# Patient Record
Sex: Male | Born: 1980 | Race: White | Hispanic: No | Marital: Married | Smoking: Former smoker
Health system: Southern US, Community
[De-identification: ages and names within clinical notes are randomized; demographics above are authoritative.]

## PROBLEM LIST (undated history)

## (undated) DIAGNOSIS — I10 Essential (primary) hypertension: Secondary | ICD-10-CM

## (undated) DIAGNOSIS — L719 Rosacea, unspecified: Secondary | ICD-10-CM

## (undated) DIAGNOSIS — F17200 Nicotine dependence, unspecified, uncomplicated: Secondary | ICD-10-CM

## (undated) DIAGNOSIS — J301 Allergic rhinitis due to pollen: Secondary | ICD-10-CM

## (undated) DIAGNOSIS — E039 Hypothyroidism, unspecified: Principal | ICD-10-CM

## (undated) DIAGNOSIS — E7800 Pure hypercholesterolemia, unspecified: Secondary | ICD-10-CM

## (undated) DIAGNOSIS — L209 Atopic dermatitis, unspecified: Secondary | ICD-10-CM

## (undated) HISTORY — DX: Essential (primary) hypertension: I10

## (undated) HISTORY — DX: Pure hypercholesterolemia, unspecified: E78.00

## (undated) HISTORY — DX: Rosacea, unspecified: L71.9

## (undated) HISTORY — DX: Atopic dermatitis, unspecified: L20.9

## (undated) HISTORY — DX: Hypothyroidism, unspecified: E03.9

## (undated) HISTORY — DX: Allergic rhinitis due to pollen: J30.1

## (undated) HISTORY — DX: Nicotine dependence, unspecified, uncomplicated: F17.200

---

## 2012-12-27 ENCOUNTER — Ambulatory Visit (INDEPENDENT_AMBULATORY_CARE_PROVIDER_SITE_OTHER): Payer: BC Managed Care – PPO | Admitting: Family Medicine

## 2012-12-27 ENCOUNTER — Encounter: Payer: Self-pay | Admitting: Family Medicine

## 2012-12-27 VITALS — BP 118/81 | HR 66 | Temp 98.3°F | Resp 16 | Ht 68.0 in | Wt 177.0 lb

## 2012-12-27 DIAGNOSIS — Z23 Encounter for immunization: Secondary | ICD-10-CM

## 2012-12-27 DIAGNOSIS — F17200 Nicotine dependence, unspecified, uncomplicated: Secondary | ICD-10-CM

## 2012-12-27 DIAGNOSIS — Z720 Tobacco use: Secondary | ICD-10-CM

## 2012-12-27 DIAGNOSIS — Z0000 Encounter for general adult medical examination without abnormal findings: Secondary | ICD-10-CM | POA: Insufficient documentation

## 2012-12-27 HISTORY — DX: Nicotine dependence, unspecified, uncomplicated: F17.200

## 2012-12-27 NOTE — Patient Instructions (Signed)

## 2012-12-27 NOTE — Progress Notes (Signed)
S:  This 32 y.o. Cauc male is in good healthy; he is a tobacco user- < 1/2 ppd. He and his wife are expecting their 1st child in August. He needs Tdap. Pt denies recent febrile illness but has had a mildly prod cough w/ clear phlegm.  He is sch for CPE on January 13, 2013. Pt had no other complaints.  PMHx, Soc Hx and Fam Hx reviewed.  ROS: Noncontributory.  O:  Filed Vitals:   12/27/12 0936  BP: 118/81  Pulse: 66  Temp: 98.3 F (36.8 C)  Resp: 16   GEN: In NAD: WN,WD. HEENT: [place]/AT; EOMI w/ clear conj/sclerae. EACs/TMs normal. Post ph mildly erythematous w/o lesions or exudate. NECK: Supple w/o LAN or TMG.  LUNGS: CTA; no wheezes or rhonchi. SKIN: W&D; no rashes or pallor. NEURO: A&O x 3; CNs intact. Nonfocal.  A/P: Need for prophylactic vaccination with combined diphtheria-tetanus-pertussis (DTP) vaccine - Plan: Tdap vaccine greater than or equal to 7yo IM  Tobacco user- advised smoking cessation prior to birth of child.

## 2013-01-13 ENCOUNTER — Other Ambulatory Visit: Payer: Self-pay | Admitting: Physician Assistant

## 2013-01-13 ENCOUNTER — Encounter: Payer: Self-pay | Admitting: Physician Assistant

## 2013-01-13 ENCOUNTER — Ambulatory Visit (INDEPENDENT_AMBULATORY_CARE_PROVIDER_SITE_OTHER): Payer: BC Managed Care – PPO | Admitting: Physician Assistant

## 2013-01-13 VITALS — BP 122/76 | HR 66 | Temp 98.2°F | Resp 16 | Ht 67.75 in | Wt 175.8 lb

## 2013-01-13 DIAGNOSIS — Z0000 Encounter for general adult medical examination without abnormal findings: Secondary | ICD-10-CM

## 2013-01-13 DIAGNOSIS — E039 Hypothyroidism, unspecified: Secondary | ICD-10-CM

## 2013-01-13 DIAGNOSIS — F17200 Nicotine dependence, unspecified, uncomplicated: Secondary | ICD-10-CM

## 2013-01-13 DIAGNOSIS — M549 Dorsalgia, unspecified: Secondary | ICD-10-CM

## 2013-01-13 LAB — POCT UA - MICROSCOPIC ONLY
Bacteria, U Microscopic: NEGATIVE
RBC, urine, microscopic: NEGATIVE
WBC, Ur, HPF, POC: NEGATIVE

## 2013-01-13 LAB — POCT URINALYSIS DIPSTICK
Bilirubin, UA: NEGATIVE
Glucose, UA: NEGATIVE
Ketones, UA: NEGATIVE
Leukocytes, UA: NEGATIVE
Protein, UA: NEGATIVE

## 2013-01-13 MED ORDER — PREDNISONE 20 MG PO TABS: ORAL_TABLET | ORAL | Status: DC

## 2013-01-13 MED ORDER — VARENICLINE TARTRATE 0.5 MG X 11 & 1 MG X 42 PO MISC: ORAL | Status: DC

## 2013-01-13 MED ORDER — CYCLOBENZAPRINE HCL 5 MG PO TABS: 5.0000 mg | ORAL_TABLET | Freq: Three times a day (TID) | ORAL | Status: DC | PRN

## 2013-01-13 NOTE — Progress Notes (Signed)
  Subjective:    Patient ID: Gregory Jennings, male    DOB: 18-Apr-1981, 32 y.o.   MRN: [id number]  HPI    Review of Systems  Constitutional: Negative.   HENT: Negative.   Eyes: Negative.   Respiratory: Positive for cough.   Cardiovascular: Negative.   Gastrointestinal: Negative.   Endocrine: Negative.   Genitourinary: Negative.   Musculoskeletal: Positive for back pain.  Skin: Negative.   Allergic/Immunologic: Negative.   Neurological: Negative.   Hematological: Negative.   Psychiatric/Behavioral: Negative.        Objective:   Physical Exam        Assessment & Plan:

## 2013-01-13 NOTE — Progress Notes (Signed)
Patient ID: Gregory Jennings MRN: [id number], DOB: [birth year] 31 y.o. Date of Encounter: 01/13/2013, 2:00 PM  Primary Physician: No primary provider on file.  Chief Complaint: Physical (CPE)  HPI: 32 y.o. male with history noted below here for CPE. Doing well. Not sure when his last CPE was. First child is due 01/30/13. Excited. Received TDaP 12/27/12 prior to the birth of his first child. Has tapered down his smoking from a 1/2 pack per day to 1-3 cigarettes per week, usually only when playing golf. Currently using Nicorette gum to help him quit smoking. Since he has started chewing this he has noticed a tickle in the back of his throat the he has to clear. He would like to quite smoking a together. He is open to starting Chantix. No history of depression or SI.   He was lifting a box a couple weeks ago and notes occasional left sided pains. He describes it similar to a pinched nerve. Pain waxes and wanes when he twists a certain way. Currently asymptomatic. No loss of bowel or bladder function. No radiation of pain proximally or distally. No weakness into the legs. No paresthesias. He also notes occasional muscles knots or spasms that are long standing, he sees a chiropractor for these.     Review of Systems: Consitutional: No fever, chills, fatigue, night sweats, lymphadenopathy, or weight changes. Eyes: No visual changes, eye redness, or discharge. ENT/Mouth: Ears: No otalgia, tinnitus, hearing loss, discharge. Nose: No congestion, rhinorrhea, sinus pain, or epistaxis. Throat: No sore throat, post nasal drip, or teeth pain. Cardiovascular: No CP, palpitations, diaphoresis, DOE, edema, orthopnea, PND. Respiratory: Positive for cough. No hemoptysis, SOB, or wheezing. Gastrointestinal: No anorexia, dysphagia, reflux, pain, nausea, vomiting, hematemesis, diarrhea, constipation, BRBPR, or melena. Genitourinary: No dysuria, frequency, urgency, hematuria, incontinence, nocturia, decreased urinary stream,  discharge, impotence, or testicular pain/masses. Musculoskeletal: Positive for back pain. No decreased ROM, myalgias, stiffness, joint swelling, or weakness. Skin: No rash, erythema, lesion changes, pain, warmth, jaundice, or pruritis. Neurological: No headache, dizziness, syncope, seizures, tremors, memory loss, coordination problems, or paresthesias. Psychological: No anxiety, depression, hallucinations, SI/HI. Endocrine: No fatigue, polydipsia, polyphagia, polyuria, or known diabetes.   History reviewed. No pertinent past medical history.   History reviewed. No pertinent past surgical history.  Home Meds:  Prior to Admission medications   Not on File    Allergies: No Known Allergies  History   Social History  . Marital Status: Married    Spouse Name: N/A    Number of Children: N/A  . Years of Education: N/A   Occupational History  . teacher    Social History Main Topics  . Smoking status: Light Tobacco Smoker  . Smokeless tobacco: Not on file  . Alcohol Use: Yes  . Drug Use: No  . Sexually Active: Not on file   Other Topics Concern  . Not on file   Social History Narrative  . No narrative on file    Family History  Problem Relation Age of Onset  . Pancreatitis Mother   . Hypertension Father   . Cancer Maternal Grandfather     colon cancer    Physical Exam: Blood pressure 122/76, pulse 66, temperature 98.2 F (36.8 C), temperature source Oral, resp. rate 16, height 5' 7.75" (1.721 m), weight 175 lb 12.8 oz (79.742 kg), SpO2 98.00%.  General: Well developed, well nourished, in no acute distress. HEENT: Normocephalic, atraumatic. Conjunctiva pink, sclera non-icteric. Pupils 2 mm constricting to 1 mm, round, regular, and equally reactive  to light and accomodation. EOMI. Internal auditory canal clear. TMs with good cone of light and without pathology. Nasal mucosa pink. Nares are without discharge. No sinus tenderness. Oral mucosa pink. Dentition normal. Pharynx  without exudate.   Neck: Supple. Trachea midline. No thyromegaly. Full ROM. No lymphadenopathy. Lungs: Clear to auscultation bilaterally without wheezes, rales, or rhonchi. Breathing is of normal effort and unlabored. Cardiovascular: RRR with S1 S2. No murmurs, rubs, or gallops appreciated. Distal pulses 2+ symmetrically. No carotid or abdominal bruits. Abdomen: Soft, non-tender, non-distended with normoactive bowel sounds. No hepatosplenomegaly or masses. No rebound/guarding. No CVA tenderness. Without hernias.  Genitourinary: Circumcised male. No penile lesions. Testes descended bilaterally, and smooth without tenderness or masses.  Musculoskeletal: Full range of motion and 5/5 strength throughout. Without swelling, atrophy, tenderness, crepitus, or warmth. Extremities without clubbing, cyanosis, or edema. Calves supple. Skin: Warm and moist without erythema, ecchymosis, wounds, or rash. Neuro: A+Ox3. CN II-XII grossly intact. Moves all extremities spontaneously. Full sensation throughout. Normal gait. DTR 2+ throughout upper and lower extremities. Finger to nose intact. Psych:  Responds to questions appropriately with a normal affect.   Studies:  Results for orders placed in visit on 01/13/13  POCT URINALYSIS DIPSTICK      Result Value Range   Color, UA yellow     Clarity, UA clear     Glucose, UA neg     Bilirubin, UA neg     Ketones, UA neg     Spec Grav, UA >=1.030     Blood, UA neg     pH, UA 5.5     Protein, UA neg     Urobilinogen, UA 0.2     Nitrite, UA neg     Leukocytes, UA Negative      CBC, CMET, Lipid, TSH all pending. Patient is fasting.   Assessment/Plan:  32 y.o. male with back strain and tobacco use here for CPE.  1) Back pain -Flexeril 5 mg 1 po tid prn #90 RF 1 -Prednisone 20 mg #12 3x2, 2x2, 1x2 no RF -Allow back time to rest  2. CPE -Received TDaP 12/27/12 -Healthy diet and exercise -Anticipatory guidance  3. Tobacco use -Chantix starting month pack  #42 no RF, SED -Precautions given -Advised smoking cessation -Patient is aware of the health risks to himself and family members  Signed, Eula Listen, PA-C 01/13/2013 2:00 PM

## 2013-01-14 LAB — CBC WITH DIFFERENTIAL/PLATELET
Basophils Absolute: 0 10*3/uL (ref 0.0–0.1)
HCT: 46.4 % (ref 39.0–52.0)
Hemoglobin: 16.1 g/dL (ref 13.0–17.0)
Lymphocytes Relative: 32 % (ref 12–46)
Lymphs Abs: 1.3 10*3/uL (ref 0.7–4.0)
Monocytes Absolute: 0.5 10*3/uL (ref 0.1–1.0)
Neutro Abs: 2.2 10*3/uL (ref 1.7–7.7)
RBC: 5.18 MIL/uL (ref 4.22–5.81)
RDW: 13.4 % (ref 11.5–15.5)
WBC: 4.3 10*3/uL (ref 4.0–10.5)

## 2013-01-14 LAB — LIPID PANEL
LDL Cholesterol: 130 mg/dL — ABNORMAL HIGH (ref 0–99)
VLDL: 27 mg/dL (ref 0–40)

## 2013-01-14 LAB — COMPREHENSIVE METABOLIC PANEL
ALT: 20 U/L (ref 0–53)
Albumin: 4.9 g/dL (ref 3.5–5.2)
Alkaline Phosphatase: 35 U/L — ABNORMAL LOW (ref 39–117)
CO2: 25 mEq/L (ref 19–32)
Potassium: 4.5 mEq/L (ref 3.5–5.3)
Sodium: 139 mEq/L (ref 135–145)
Total Bilirubin: 0.5 mg/dL (ref 0.3–1.2)
Total Protein: 7 g/dL (ref 6.0–8.3)

## 2013-01-14 LAB — T3: T3, Total: 84.5 ng/dL (ref 80.0–204.0)

## 2013-01-14 NOTE — Addendum Note (Signed)
Addended by: Sondra Barges on: 01/14/2013 09:19 AM   Modules accepted: Orders

## 2013-01-16 ENCOUNTER — Telehealth: Payer: Self-pay

## 2013-01-16 NOTE — Telephone Encounter (Signed)
Patient returning a call about lab results. [phone]

## 2013-01-16 NOTE — Telephone Encounter (Signed)
Please call the patient. I apologize for the delay in getting your labs to you, I had to add on 2 tests related to your thyroid. Thyroid function studies reveal subclinical hypothyroidism. This is where your thyroid is still functioning good enough to not require medication, but the labs reveal it does appear to be slowing down. It is good to follow up this value with a repeat study in a month, as they can be transient so I do want to repeat this before we go ahead and make this final. So lets recheck in a month to discuss this further. Please let him know his cholesterol numbers and glucose.  Thanks, called patient about labs. Left another message/ see lab.

## 2013-02-18 ENCOUNTER — Telehealth: Payer: Self-pay

## 2013-02-18 NOTE — Telephone Encounter (Signed)
Spoke with pt advised labs are not fasting and he can come in for lab draw only. Pt understood.

## 2013-02-18 NOTE — Telephone Encounter (Signed)
Patient is needing lab redone per ryan but I don not see an order for the lab work.  Patient wants to know if he needs to be fasting and if we can put in an order for the labs to be done without being seen by a provider.   [phone]

## 2013-06-17 ENCOUNTER — Other Ambulatory Visit: Payer: Self-pay | Admitting: Family Medicine

## 2013-06-17 DIAGNOSIS — R7989 Other specified abnormal findings of blood chemistry: Secondary | ICD-10-CM

## 2013-06-18 ENCOUNTER — Ambulatory Visit
Admission: RE | Admit: 2013-06-18 | Discharge: 2013-06-18 | Disposition: A | Discharge status: Home / Self Care | Payer: BC Managed Care – PPO | Source: Ambulatory Visit | Attending: Family Medicine | Admitting: Family Medicine

## 2013-06-18 DIAGNOSIS — R7989 Other specified abnormal findings of blood chemistry: Secondary | ICD-10-CM

## 2014-05-02 ENCOUNTER — Ambulatory Visit (INDEPENDENT_AMBULATORY_CARE_PROVIDER_SITE_OTHER): Payer: BC Managed Care – PPO | Admitting: Family Medicine

## 2014-05-02 VITALS — BP 112/86 | HR 86 | Temp 98.4°F | Resp 16 | Ht 68.75 in | Wt 181.4 lb

## 2014-05-02 DIAGNOSIS — R509 Fever, unspecified: Secondary | ICD-10-CM

## 2014-05-02 DIAGNOSIS — J029 Acute pharyngitis, unspecified: Secondary | ICD-10-CM

## 2014-05-02 LAB — POCT INFLUENZA A/B
INFLUENZA A, POC: NEGATIVE
Influenza B, POC: NEGATIVE

## 2014-05-02 LAB — POCT CBC
Granulocyte percent: 77.8 %G (ref 37–80)
HCT, POC: 45.2 % (ref 43.5–53.7)
Hemoglobin: 15.2 g/dL (ref 14.1–18.1)
Lymph, poc: 1.2 (ref 0.6–3.4)
MCH, POC: 30.9 pg (ref 27–31.2)
MCHC: 33.6 g/dL (ref 31.8–35.4)
MCV: 92.1 fL (ref 80–97)
MID (CBC): 0.9 (ref 0–0.9)
MPV: 7 fL (ref 0–99.8)
POC Granulocyte: 7.4 — AB (ref 2–6.9)
POC LYMPH %: 12.3 % (ref 10–50)
POC MID %: 9.9 % (ref 0–12)
Platelet Count, POC: 214 10*3/uL (ref 142–424)
RBC: 4.91 M/uL (ref 4.69–6.13)
RDW, POC: 12.8 %
WBC: 9.5 10*3/uL (ref 4.6–10.2)

## 2014-05-02 LAB — POCT RAPID STREP A (OFFICE): RAPID STREP A SCREEN: NEGATIVE

## 2014-05-02 MED ORDER — PENICILLIN V POTASSIUM 500 MG PO TABS: 500.0000 mg | ORAL_TABLET | Freq: Two times a day (BID) | ORAL | Status: DC

## 2014-05-02 NOTE — Progress Notes (Signed)
[facility] [address], [place] [address] [phone]  Date:  05/02/2014   Name:  Gregory Jennings Maye   DOB:  06/13/1980   MRN:  [id number]  PCP:  No primary care provider on file.    Chief Complaint: Fever; Chills; Sore Throat; Generalized Body Aches; and Headache   History of Present Illness:  Gregory Jennings Mcguiness is a 33 y.o. very pleasant male patient who presents with the following:  His son was recently ill with a ST and fever; he was tx with augmentin and is getting better.  He is here today with headches, aches, fever up to 102 yesterday,   He is using ibuprofen and did take some this am He notes a hoarse voice.  His ST and pain with swallowing is getting worse.   No GI symtoms.   He is generally in good health   They have a 15 month son at home, and his wife is due again in February.   He is worried about getting other people ill at home Patient Active Problem List   Diagnosis Date Noted  . Health care maintenance 12/27/2012  . Tobacco user 12/27/2012    No past medical history on file.  No past surgical history on file.  History  Substance Use Topics  . Smoking status: Former Smoker    Quit date: 07/02/2008  . Smokeless tobacco: Not on file  . Alcohol Use: 0.0 oz/week    0 Not specified per week    Family History  Problem Relation Age of Onset  . Pancreatitis Mother   . Hypertension Father   . Cancer Maternal Grandfather     colon cancer    No Known Allergies  Medication list has been reviewed and updated.  Current Outpatient Prescriptions on File Prior to Visit  Medication Sig Dispense Refill  . cyclobenzaprine (FLEXERIL) 5 MG tablet Take 1 tablet (5 mg total) by mouth 3 (three) times daily as needed for muscle spasms. 90 tablet 1  . predniSONE (DELTASONE) 20 MG tablet 3 PO FOR 2 DAYS, 2 PO FOR 2 DAYS, 1 PO FOR 2 DAYS 12 tablet 0  . varenicline (CHANTIX STARTING MONTH PAK) 0.5 MG X 11 & 1 MG X 42 tablet AS DIRECTED 42 tablet 0    No current facility-administered medications on file prior to visit.    Review of Systems:  As per HPI- otherwise negative.   Physical Examination: Filed Vitals:   05/02/14 0905  BP: 112/86  Pulse: 86  Temp: 98.4 F (36.9 C)  Resp: 16   Filed Vitals:   05/02/14 0905  Height: 5' 8.75" (1.746 m)  Weight: 181 lb 6.4 oz (82.283 kg)   Body mass index is 26.99 kg/(m^2). Ideal Body Weight: Weight in (lb) to have BMI = 25: 167.7  GEN: WDWN, NAD, Non-toxic, A & O x , appears to be mildly ill HEENT: Atraumatic, Normocephalic. Neck supple. No masses, No LAD.  Bilateral TM wnl, oropharynx injected but no exudate.  PEERL,EOMI.   Ears and Nose: No external deformity. CV: RRR, No M/G/R. No JVD. No thrill. No extra heart sounds. PULM: CTA B, no wheezes, crackles, rhonchi. No retractions. No resp. distress. No accessory muscle use. ABD: S, NT, ND EXTR: No c/c/e NEURO Normal gait.  PSYCH: Normally interactive. Conversant. Not depressed or anxious appearing.  Calm demeanor.   Results for orders placed or performed in visit on 05/02/14  POCT rapid strep A  Result Value Ref Range  Rapid Strep A Screen Negative Negative  POCT CBC  Result Value Ref Range   WBC 9.5 4.6 - 10.2 K/uL   Lymph, poc 1.2 0.6 - 3.4   POC LYMPH PERCENT 12.3 10 - 50 %L   MID (cbc) 0.9 0 - 0.9   POC MID % 9.9 0 - 12 %M   POC Granulocyte 7.4 (A) 2 - 6.9   Granulocyte percent 77.8 37 - 80 %G   RBC 4.91 4.69 - 6.13 M/uL   Hemoglobin 15.2 14.1 - 18.1 g/dL   HCT, POC 62.145.2 30.843.5 - 53.7 %   MCV 92.1 80 - 97 fL   MCH, POC 30.9 27 - 31.2 pg   MCHC 33.6 31.8 - 35.4 g/dL   RDW, POC 65.712.8 %   Platelet Count, POC 214 142 - 424 K/uL   MPV 7.0 0 - 99.8 fL  POCT Influenza A/B  Result Value Ref Range   Influenza A, POC Negative    Influenza B, POC Negative      Assessment and Plan: Sore throat - Plan: POCT rapid strep A, Culture, Group A Strep, penicillin v potassium (VEETID) 500 MG tablet  Other specified fever -  Plan: POCT rapid strep A, POCT CBC, POCT Influenza A/B  Testing negative as above.  Will cover with penicillin for possible strep throat while we await throat culture.  He will rest, let me know if not better in the next few days   Signed Abbe AmsterdamJessica Nahiara Kretzschmar, MD

## 2014-05-02 NOTE — Patient Instructions (Signed)
Rest and drink plenty of fluids, tylenol and ibuprofen as needed We are going to treat you for possible strep throat with penicillin I will be in touch with your culture asap Let me know if you do not feel better in 1-2 days- Sooner if worse.

## 2014-05-04 ENCOUNTER — Telehealth: Payer: Self-pay | Admitting: *Deleted

## 2014-05-04 LAB — CULTURE, GROUP A STREP: Organism ID, Bacteria: NORMAL

## 2014-05-04 NOTE — Telephone Encounter (Signed)
Pt called for lab results and they are not back yet.  He states that he is no better.  Advised that he RTC.

## 2015-02-21 ENCOUNTER — Ambulatory Visit (INDEPENDENT_AMBULATORY_CARE_PROVIDER_SITE_OTHER): Payer: BC Managed Care – PPO | Admitting: Physician Assistant

## 2015-02-21 VITALS — BP 129/89 | HR 71 | Temp 98.4°F | Resp 16 | Ht 68.0 in | Wt 194.4 lb

## 2015-02-21 DIAGNOSIS — M546 Pain in thoracic spine: Secondary | ICD-10-CM

## 2015-02-21 MED ORDER — TRAMADOL HCL 50 MG PO TABS: 50.0000 mg | ORAL_TABLET | Freq: Three times a day (TID) | ORAL | Status: DC | PRN

## 2015-02-21 MED ORDER — MELOXICAM 15 MG PO TABS: 15.0000 mg | ORAL_TABLET | Freq: Every day | ORAL | Status: DC

## 2015-02-21 MED ORDER — CYCLOBENZAPRINE HCL ER 15 MG PO CP24: 15.0000 mg | ORAL_CAPSULE | Freq: Every day | ORAL | Status: DC | PRN

## 2015-02-21 NOTE — Progress Notes (Signed)
   Subjective:    Patient ID: Gregory Jennings, male    DOB: 1980-12-28, 34 y.o.   MRN: [id number]  HPI Patient presents for left-sided middle back pain that has been present for the past 3 days. Pain is achy and sharp at times. Is 8/10 on pain scale and is now radiating to left side. Pain is constant and is marginally relieved with ibuprofen. Used anikan cream which broke him out and didn't help with pain. Denies numbness, weakness, or loss of fxn/sensation/ROM. Denies trauma, but admits that he may have worked out improperly when lifting weights. Rest of back unaffected and no pain or numbness of lower extremity. No urinary or bowel changes. NKDA.   Review of Systems  Constitutional: Negative.  Negative for fever and activity change.  Genitourinary: Negative.   Musculoskeletal: Positive for back pain. Negative for joint swelling, arthralgias, gait problem, neck pain and neck stiffness.  Skin: Positive for rash.  Neurological: Negative for dizziness, weakness, numbness and headaches.       Objective:   Physical Exam  Constitutional: He is oriented to person, place, and time. He appears well-developed and well-nourished. No distress.  Blood pressure 129/89, pulse 71, temperature 98.4 F (36.9 C), temperature source Oral, resp. rate 16, height  (1.727 m), weight 194 lb 6.4 oz (88.179 kg), SpO2 98 %.   HENT:  Head: Normocephalic and atraumatic.  Right Ear: External ear normal.  Left Ear: External ear normal.  Eyes: Conjunctivae are normal. Right eye exhibits no discharge. Left eye exhibits no discharge. No scleral icterus.  Pulmonary/Chest: Effort normal.  Musculoskeletal: Normal range of motion. He exhibits no edema or tenderness.       Right shoulder: Normal.       Left shoulder: Normal.       Cervical back: Normal.       Thoracic back: He exhibits pain. He exhibits normal range of motion, no tenderness, no bony tenderness, no swelling, no edema, no deformity, no laceration and no  spasm.       Lumbar back: Normal.  Neurological: He is alert and oriented to person, place, and time. No cranial nerve deficit. He exhibits normal muscle tone. Coordination normal.  Skin: Skin is warm and dry. Rash (contact dermatitis on left side of back and left flank) noted. He is not diaphoretic. No erythema. No pallor.  Psychiatric: He has a normal mood and affect. His behavior is normal. Judgment and thought content normal.       Assessment & Plan:  1. Left-sided thoracic back pain Back stretches given. Advised to use warm compress/heating pad after exercises and can apply 3-4x daily for 15-20 minutes. - cyclobenzaprine (AMRIX) 15 MG 24 hr capsule; Take 1 capsule (15 mg total) by mouth daily as needed for muscle spasms.  Dispense: 15 capsule; Refill: 0 - meloxicam (MOBIC) 15 MG tablet; Take 1 tablet (15 mg total) by mouth daily.  Dispense: 30 tablet; Refill: 1 - traMADol (ULTRAM) 50 MG tablet; Take 1 tablet (50 mg total) by mouth every 8 (eight) hours as needed.  Dispense: 30 tablet; Refill: 0   Kal Chait PA-C  [facility] 02/21/2015 10:41 AM

## 2015-02-21 NOTE — Patient Instructions (Signed)

## 2015-11-27 IMAGING — US US SOFT TISSUE HEAD/NECK
1 series · 14 of 25 positions shown · non-contrast
Comparison: None.

CLINICAL DATA: Elevated TSH.

EXAM:
THYROID ULTRASOUND
TECHNIQUE: Ultrasound examination of the thyroid gland and adjacent soft
tissues was performed.

[Series 1: us soft tissue head/neck · 0.08mm/px · 14 of 42 slices shown]
[im 1/42]
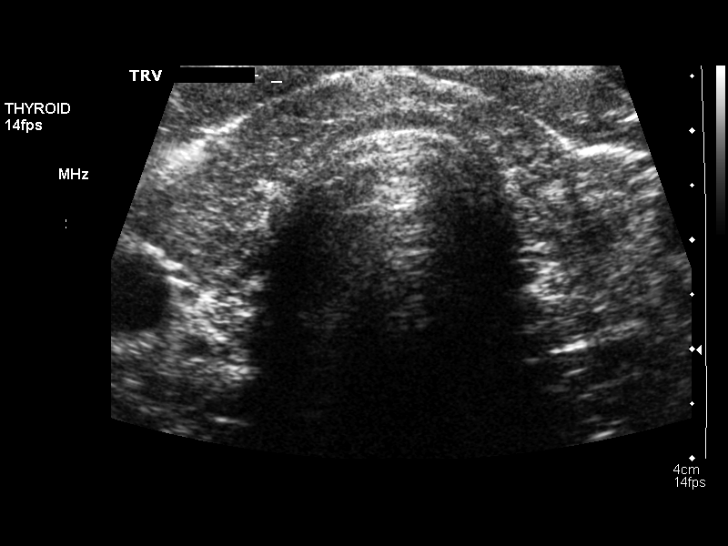
[im 4/42]
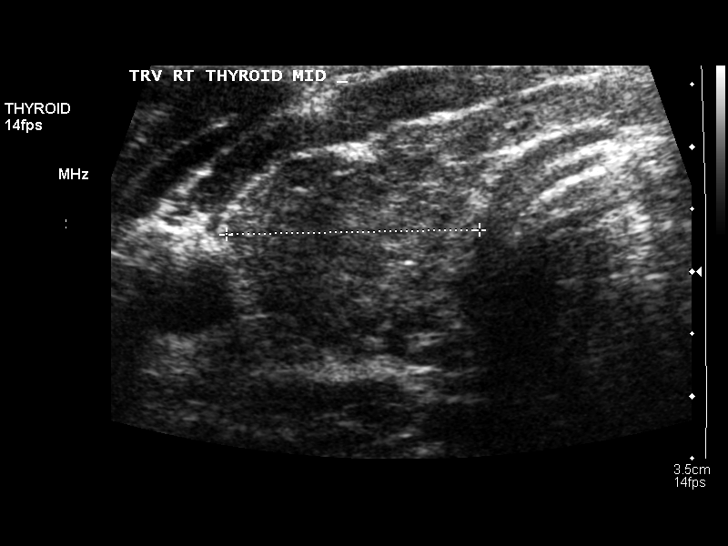
[im 7/42]
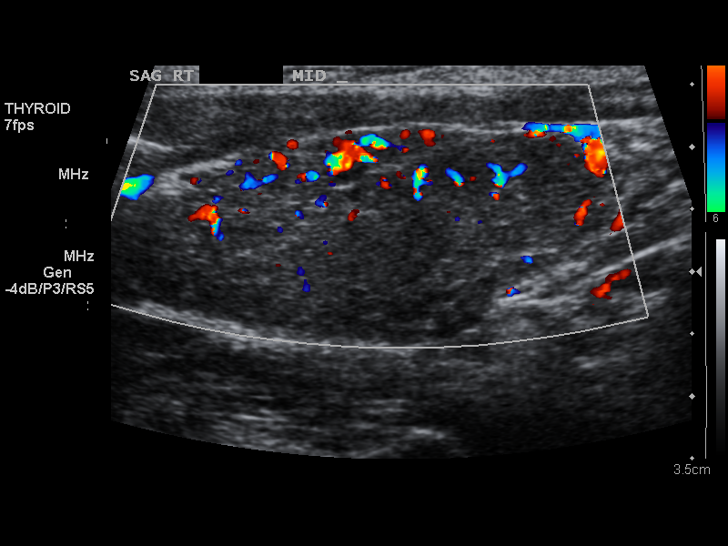
[im 11/42]
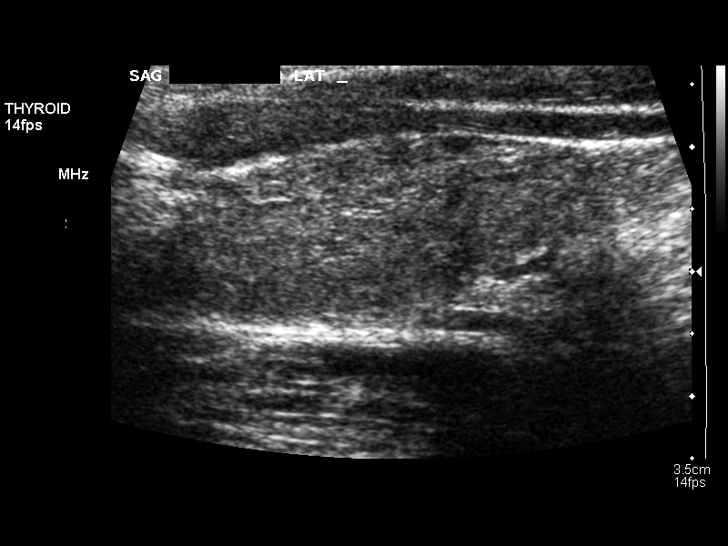
[im 14/42]
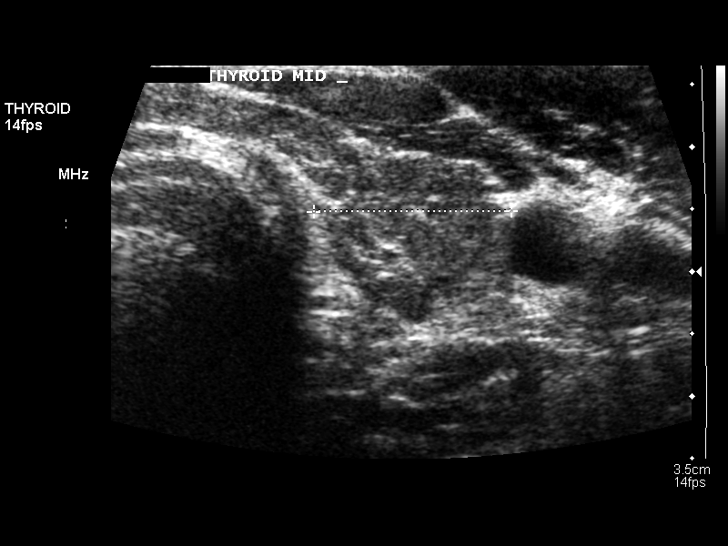
[im 16/42]
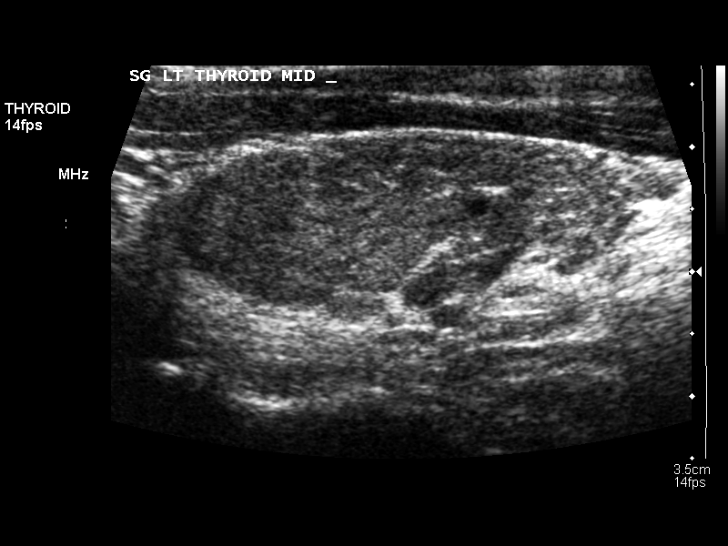
[im 19/42]
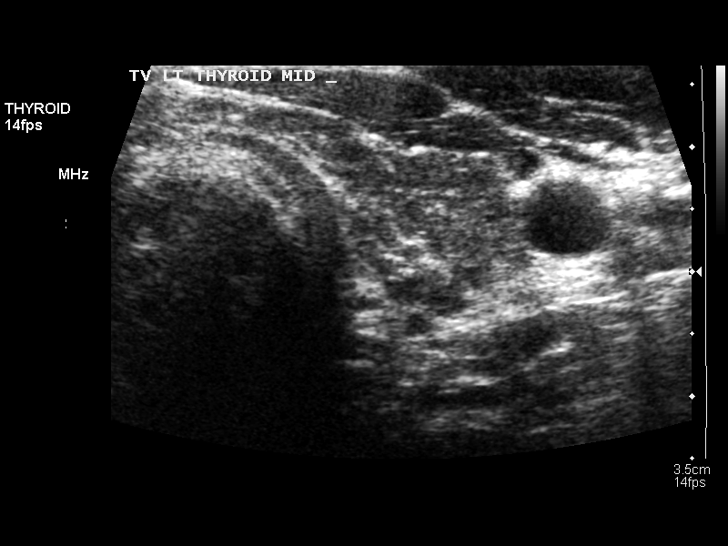
[im 23/42]
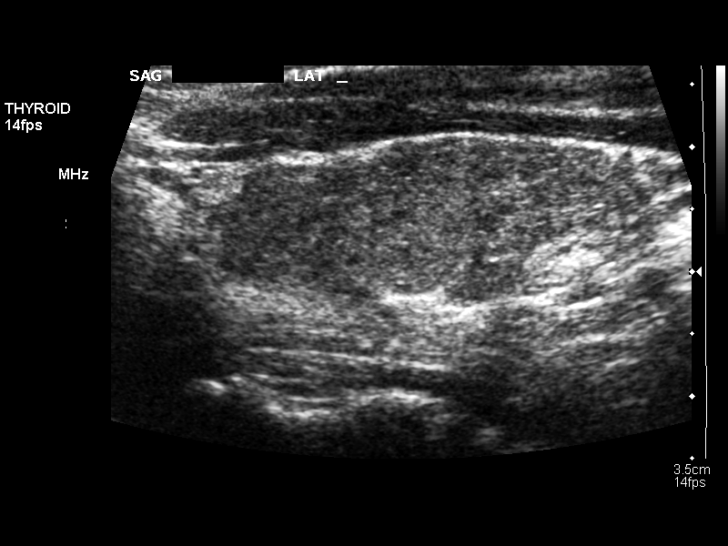
[im 26/42]
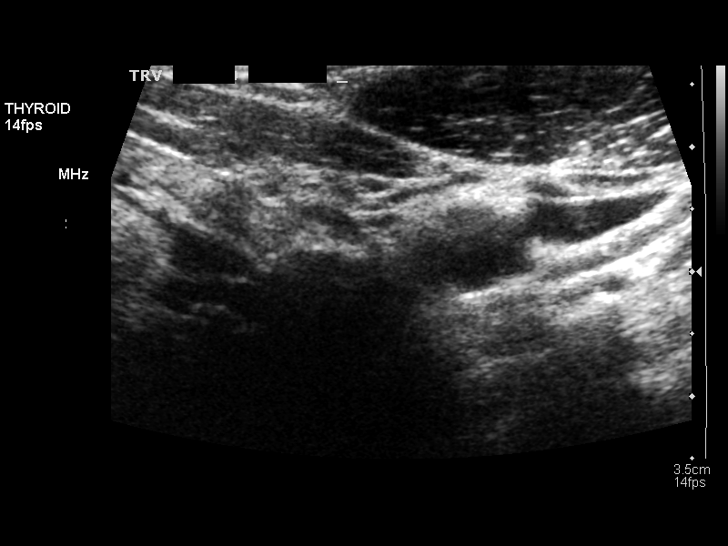
[im 28/42]
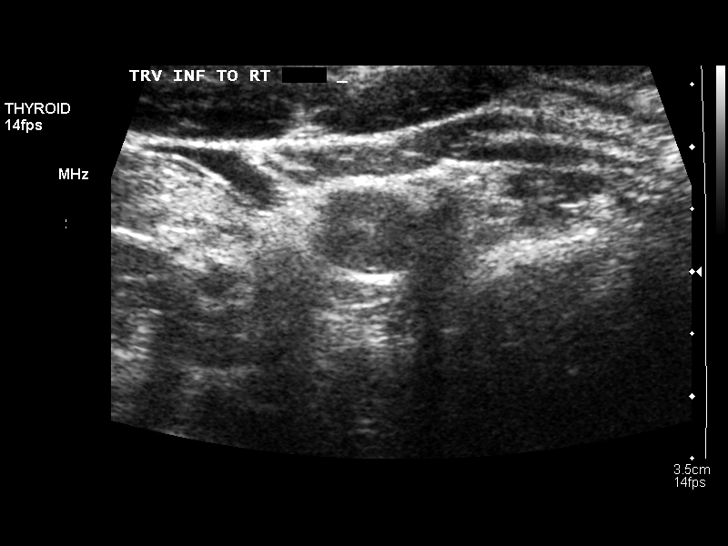
[im 31/42]
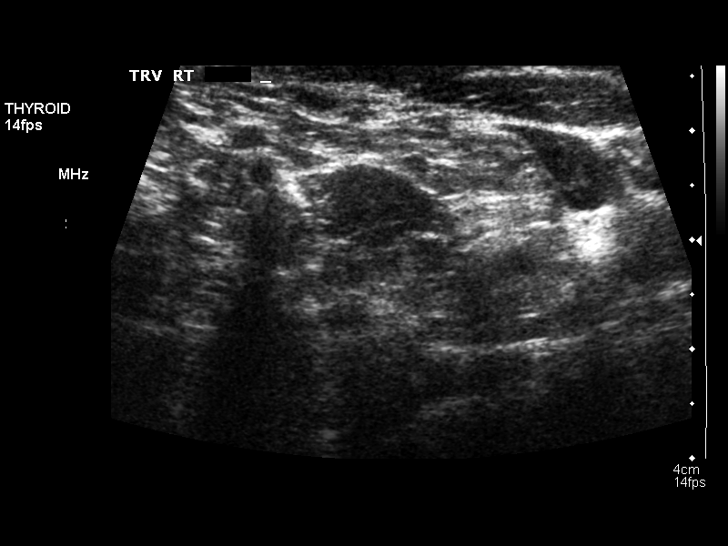
[im 35/42]
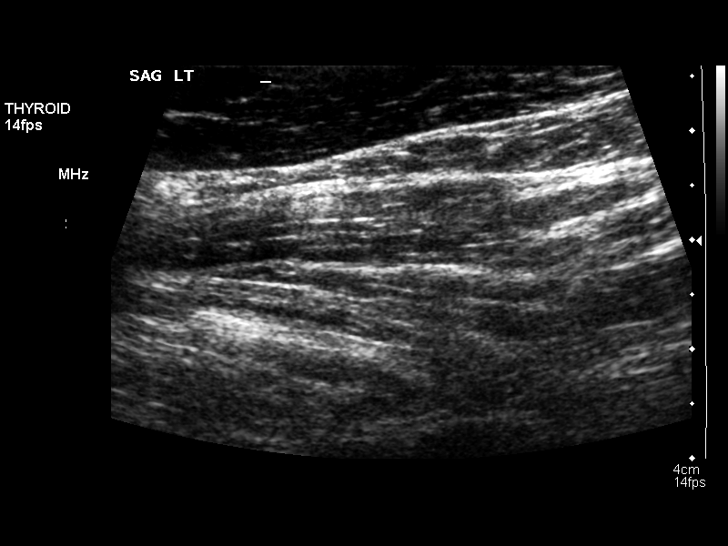
[im 38/42]
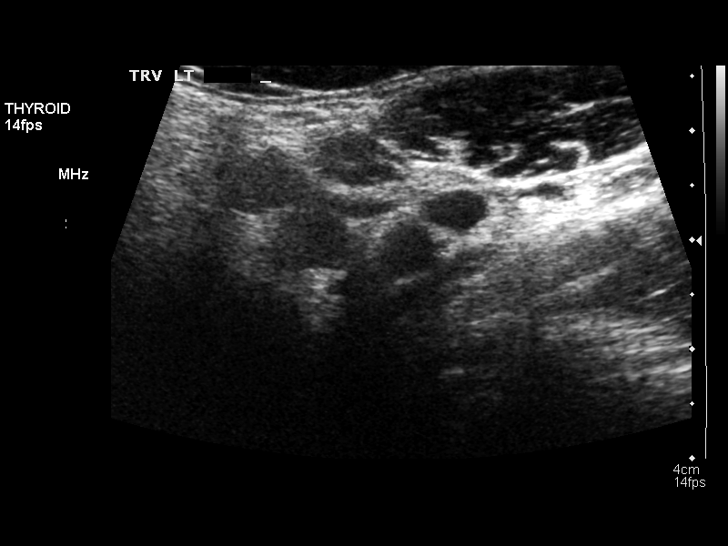
[im 42/42]
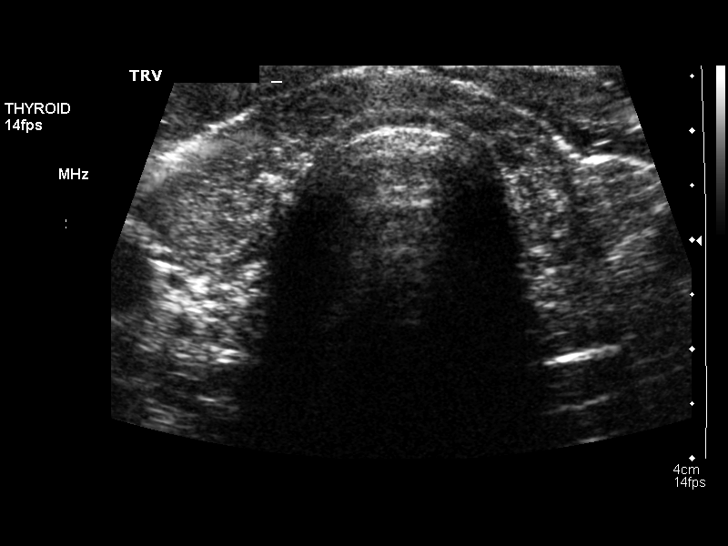

[14 of 25 positions shown; findings below may reference images not displayed]

FINDINGS: Right thyroid lobe

Measurements: 4.3 x 1.7 x 2.0 cm.  No nodules visualized.

Left thyroid lobe

Measurements: 4.0 x 1.5 x 1.6 cm.  No nodules visualized.

Isthmus

Thickness: 0.5 cm.  No nodules visualized.

Thyroid gland volume is at the upper limits of normal. The thyroid
echotexture is mildly heterogeneous bilaterally. There also
subjectively is mildly increased vascularity throughout both lobes
which can be seen in the setting of thyroiditis.

Lymphadenopathy

None visualized.
IMPRESSION: Top-normal thyroid gland size with heterogeneous echotexture noted
bilaterally as well as increased vascularity. This can be seen with
thyroiditis. No solid or cystic nodules are identified.

## 2016-01-24 ENCOUNTER — Ambulatory Visit (INDEPENDENT_AMBULATORY_CARE_PROVIDER_SITE_OTHER): Payer: BC Managed Care – PPO | Admitting: Family Medicine

## 2016-01-24 ENCOUNTER — Encounter: Payer: Self-pay | Admitting: Family Medicine

## 2016-01-24 DIAGNOSIS — M25512 Pain in left shoulder: Secondary | ICD-10-CM | POA: Diagnosis not present

## 2016-01-24 DIAGNOSIS — M62838 Other muscle spasm: Secondary | ICD-10-CM

## 2016-01-24 MED ORDER — TIZANIDINE HCL 4 MG PO TABS: 4.0000 mg | ORAL_TABLET | Freq: Every evening | ORAL | 2 refills | Status: AC

## 2016-01-24 MED ORDER — MELOXICAM 15 MG PO TABS: 15.0000 mg | ORAL_TABLET | Freq: Every day | ORAL | 0 refills | Status: DC

## 2016-01-24 MED ORDER — TRAMADOL HCL 50 MG PO TABS: 50.0000 mg | ORAL_TABLET | Freq: Three times a day (TID) | ORAL | 0 refills | Status: DC | PRN

## 2016-01-24 NOTE — Assessment & Plan Note (Signed)
Patient was given trigger point injections today. Discussed icing regimen, home exercises, given meloxicam, muscle relaxer as well as tramadol for breaks or pain. We discussed home exercises and given a handout. We also discussed with patient about icing as well as even heat. Patient will follow-up in one week if no significant improvement. Past medical history is significant for shingles previously on the side but it was anteriorly. We'll monitor for break out.

## 2016-01-24 NOTE — Patient Instructions (Signed)
Good to see you  Ice 20 minutes 2 times daily. Usually after activity and before bed. Exercises 3 times a week. Start in 48 hours.  Meloxicam daily for next 10 days then as needed Zanaflex at night for 3 nights then as needed Tramadol for breakthrough pain but only if really need and do not drive if you take it.  See me again in 1 week if not perfect or call me.

## 2016-01-24 NOTE — Progress Notes (Signed)
Tawana ScaleZach Braycen Burandt D.O. [facility] [address] [place], [place] [address] Phone: [phone] Subjective:     CC: right low back pain   OZH:[id number]:Subjective  Gregory MellowSpencer Jennings is a 35 y.o. male coming in with complaint of low back pain.  Patient says that this is been going on for 3 days. Seems to be worsening. Seems to be on the upper rider sign. An states that only certain movements seem to be doing them. Possibly an injury while pushing sign on a swing. Denies any radiation down the upper actually lower extremities. Seems to stay located specifically. Has tried the muscle relaxer with very minimal improvement.     No past medical history on file. No past surgical history on file. Social History   Social History  . Marital status: Married    Spouse name: N/A  . Number of children: N/A  . Years of education: N/A   Occupational History  . teacher [employer]   Social History Main Topics  . Smoking status: Former Smoker    Quit date: 07/02/2008  . Smokeless tobacco: None  . Alcohol use 0.0 oz/week  . Drug use: No  . Sexual activity: Not Asked   Other Topics Concern  . None   Social History Narrative  . None   No Known Allergies Family History  Problem Relation Age of Onset  . Pancreatitis Mother   . Hypertension Father   . Cancer Maternal Grandfather     colon cancer    Past medical history, social, surgical and family history all reviewed in electronic medical record.  No pertanent information unless stated regarding to the chief complaint.   Review of Systems: No headache, visual changes, nausea, vomiting, diarrhea, constipation, dizziness, abdominal pain, skin rash, fevers, chills, night sweats, weight loss, swollen lymph nodes, body aches, joint swelling, muscle aches, chest pain, shortness of breath, mood changes.   Objective  Blood pressure 140/86, pulse 83, height 5\' 7"  (1.702 m), weight 181 lb (82.1 kg), SpO2 97 %.  General: No apparent distress alert and  oriented x3 mood and affect normal, dressed appropriately.  HEENT: Pupils equal, extraocular movements intact  Respiratory: Patient's speak in full sentences and does not appear short of breath  Cardiovascular: No lower extremity edema, non tender, no erythema  Skin: Warm dry intact with no signs of infection or rash on extremities or on axial skeleton.  Abdomen: Soft nontender  Neuro: Cranial nerves II through XII are intact, neurovascularly intact in all extremities with 2+ DTRs and 2+ pulses.  Lymph: No lymphadenopathy of posterior or anterior cervical chain or axillae bilaterally.  Gait normal with good balance and coordination.  MSK:  Non tender with full range of motion and good stability and symmetric strength and tone of shoulders, elbows, wrist, hip, knee and ankles bilaterally.   Back Exam:  Inspection: Unremarkable  Motion: Flexion 35 deg, Extension 25 deg, Side Bending to 45 deg bilaterally,  Rotation to 45 deg bilaterally  SLR laying: Negative  XSLR laying: Negative  Palpable tenderness: Severe tenderness in the thoracic spine T7-T10 and paraspinal musculature. Multiple trigger points noted.Marland Kitchen. FABER: negative. Sensory change: Gross sensation intact to all lumbar and sacral dermatomes.  Reflexes: 2+ at both patellar tendons, 2+ at achilles tendons, Babinski's downgoing.  Strength at foot  Plantar-flexion: 5/5 Dorsi-flexion: 5/5 Eversion: 5/5 Inversion: 5/5  Leg strength  Quad: 5/5 Hamstring: 5/5 Hip flexor: 5/5 Hip abductors: 5/5  Gait unremarkable.  After verbal consent patient was prepped with alcohol swabs  and with a 25-gauge half-inch needle was injected with a total of 3 mL of 0.5% Marcaine and 1 mL of Kenalog 40 mg/dL and 4 distinct trigger points Post injection instructions given. Band-Aids placed. No blood loss.   Impression and Recommendations:     This case required medical decision making of moderate complexity.      Note: This dictation was prepared with  Dragon dictation along with smaller phrase technology. Any transcriptional errors that result from this process are unintentional.

## 2017-03-29 ENCOUNTER — Ambulatory Visit (INDEPENDENT_AMBULATORY_CARE_PROVIDER_SITE_OTHER): Payer: BC Managed Care – PPO | Admitting: Family Medicine

## 2017-03-29 ENCOUNTER — Encounter: Payer: Self-pay | Admitting: Family Medicine

## 2017-03-29 VITALS — BP 134/82 | HR 80 | Temp 97.9°F | Ht 68.0 in | Wt 187.8 lb

## 2017-03-29 DIAGNOSIS — Z23 Encounter for immunization: Secondary | ICD-10-CM

## 2017-03-29 DIAGNOSIS — L209 Atopic dermatitis, unspecified: Secondary | ICD-10-CM | POA: Insufficient documentation

## 2017-03-29 DIAGNOSIS — R5383 Other fatigue: Secondary | ICD-10-CM

## 2017-03-29 DIAGNOSIS — E7800 Pure hypercholesterolemia, unspecified: Secondary | ICD-10-CM

## 2017-03-29 DIAGNOSIS — L719 Rosacea, unspecified: Secondary | ICD-10-CM

## 2017-03-29 DIAGNOSIS — E039 Hypothyroidism, unspecified: Secondary | ICD-10-CM | POA: Diagnosis not present

## 2017-03-29 DIAGNOSIS — I10 Essential (primary) hypertension: Secondary | ICD-10-CM | POA: Diagnosis not present

## 2017-03-29 DIAGNOSIS — J301 Allergic rhinitis due to pollen: Secondary | ICD-10-CM | POA: Insufficient documentation

## 2017-03-29 HISTORY — DX: Allergic rhinitis due to pollen: J30.1

## 2017-03-29 HISTORY — DX: Hypothyroidism, unspecified: E03.9

## 2017-03-29 HISTORY — DX: Rosacea, unspecified: L71.9

## 2017-03-29 HISTORY — DX: Atopic dermatitis, unspecified: L20.9

## 2017-03-29 HISTORY — DX: Essential (primary) hypertension: I10

## 2017-03-29 HISTORY — DX: Pure hypercholesterolemia, unspecified: E78.00

## 2017-03-29 LAB — CBC WITH DIFFERENTIAL/PLATELET
Basophils Absolute: 0 10*3/uL (ref 0.0–0.1)
Basophils Relative: 0.8 % (ref 0.0–3.0)
Eosinophils Absolute: 0 10*3/uL (ref 0.0–0.7)
Eosinophils Relative: 0.6 % (ref 0.0–5.0)
HCT: 47.1 % (ref 39.0–52.0)
Hemoglobin: 16.1 g/dL (ref 13.0–17.0)
Lymphocytes Relative: 30.5 % (ref 12.0–46.0)
Lymphs Abs: 1.5 10*3/uL (ref 0.7–4.0)
MCHC: 34.3 g/dL (ref 30.0–36.0)
MCV: 88.6 fl (ref 78.0–100.0)
Monocytes Absolute: 0.6 10*3/uL (ref 0.1–1.0)
Monocytes Relative: 11.9 % (ref 3.0–12.0)
Neutro Abs: 2.7 10*3/uL (ref 1.4–7.7)
Neutrophils Relative %: 56.2 % (ref 43.0–77.0)
Platelets: 301 10*3/uL (ref 150.0–400.0)
RBC: 5.32 Mil/uL (ref 4.22–5.81)
RDW: 12.5 % (ref 11.5–15.5)
WBC: 4.8 10*3/uL (ref 4.0–10.5)

## 2017-03-29 LAB — COMPREHENSIVE METABOLIC PANEL
ALT: 29 U/L (ref 0–53)
AST: 20 U/L (ref 0–37)
Albumin: 5.1 g/dL (ref 3.5–5.2)
Alkaline Phosphatase: 32 U/L — ABNORMAL LOW (ref 39–117)
BUN: 17 mg/dL (ref 6–23)
CO2: 26 mEq/L (ref 19–32)
Calcium: 9.8 mg/dL (ref 8.4–10.5)
Chloride: 105 mEq/L (ref 96–112)
Creatinine, Ser: 0.86 mg/dL (ref 0.40–1.50)
GFR: 106.87 mL/min (ref >60.00)
Glucose, Bld: 96 mg/dL (ref 70–99)
Potassium: 4.1 mEq/L (ref 3.5–5.1)
Sodium: 139 mEq/L (ref 135–145)
Total Bilirubin: 0.8 mg/dL (ref 0.2–1.2)
Total Protein: 7.3 g/dL (ref 6.0–8.3)

## 2017-03-29 MED ORDER — DOXYCYCLINE HYCLATE 50 MG PO CAPS: 50.0000 mg | ORAL_CAPSULE | Freq: Every day | ORAL | 3 refills | Status: DC

## 2017-03-29 MED ORDER — THYROID 30 MG PO TABS: 30.0000 mg | ORAL_TABLET | Freq: Every day | ORAL | 0 refills | Status: DC

## 2017-03-29 MED ORDER — THYROID 240 MG PO TABS: 240.0000 mg | ORAL_TABLET | Freq: Every day | ORAL | 1 refills | Status: DC

## 2017-03-29 NOTE — Progress Notes (Signed)
Gregory Jennings is a 36 y.o. male is here to [facility].   Patient Care Team: Helane Rima, DO as PCP - General (Family Medicine)   History of Present Illness:   [name], CMA, acting as scribe for Dr. Earlene Plater.  HPI:   1. Rosacea. Just started Metrogel through Dermatologist. Causing increased redness.   2. Acquired hypothyroidism. Rx Armour Thyroid. Endocrine ROS: negative for - hair pattern changes, hot flashes, mood swings, palpitations, skin changes, temperature intolerance or unexpected weight changes.    3. Pure hypercholesterolemia. Rx Crestor and Fenofibrate. Cardiovascular ROS: negative for - leg cramps.   4. Essential hypertension. Rx Losartan. Cardiovascular ROS: no chest pain or dyspnea on exertion.   5. Atopic dermatitis. Rx prn steroid cream.   6. Seasonal allergic rhinitis due to pollen. Rx Flonase.     Health Maintenance Due  Topic Date Due  . HIV Screening  02/13/1996  . INFLUENZA VACCINE  01/10/2017   Depression screen PHQ 2/9 03/29/2017  Decreased Interest 0  Down, Depressed, Hopeless 0  PHQ - 2 Score 0   PMHx, SurgHx, SocialHx, Medications, and Allergies were reviewed in the Visit Navigator and updated as appropriate.   Past Medical History:  Diagnosis Date  . Acquired hypothyroidism 03/29/2017  . Atopic dermatitis 03/29/2017  . Essential hypertension 03/29/2017  . Hypertension   . Nicotine dependence 12/27/2012  . Pure hypercholesterolemia 03/29/2017  . Rosacea 03/29/2017  . Seasonal allergic rhinitis due to pollen 03/29/2017   No past surgical history on file.  Family History  Problem Relation Age of Onset  . Pancreatitis Mother   . Hypertension Father   . Colon cancer Maternal Grandfather    Social History  Substance Use Topics  . Smoking status: Former Smoker    Quit date: 07/02/2008  . Smokeless tobacco: Never Used  . Alcohol use 0.0 oz/week     Comment: Stopped drinking 5 weeks ago    Current Medications and Allergies:    .  clobetasol (TEMOVATE) 0.05 % GEL, Apply topically 2 (two) times daily., Disp: , Rfl:  .  fenofibrate 54 MG tablet, Take 54 mg by mouth daily., Disp: , Rfl:  .  fluticasone (FLONASE) 50 MCG/ACT nasal spray, Place into both nostrils daily., Disp: , Rfl:  .  hydrocortisone cream 0.5 %, Apply 1 application topically 2 (two) times daily., Disp: , Rfl:  .  losartan (COZAAR) 100 MG tablet, Take 100 mg by mouth daily., Disp: , Rfl:  .  metroNIDAZOLE (METROGEL) 0.75 % gel, Apply 1 application topically 2 (two) times daily., Disp: , Rfl:  .  rosuvastatin (CRESTOR) 5 MG tablet, Take 5 mg by mouth daily., Disp: , Rfl:  .  thyroid (ARMOUR) 240 MG tablet, Take 240 mg by mouth daily., Disp: , Rfl:  .  thyroid (ARMOUR) 30 MG tablet, Take 30 mg by mouth daily before breakfast., Disp: , Rfl:   NOTE: Dose of armour thyroid is 1/2 (240) + 1/2 (30) daily  No Known Allergies   Review of Systems:   Pertinent items are noted in the HPI. Otherwise, ROS is negative.  Vitals:   Vitals:   03/29/17 0956  BP: 134/82  Pulse: 80  Temp: 97.9 F (36.6 C)  TempSrc: Oral  SpO2: 97%  Weight: 187 lb 12.8 oz (85.2 kg)  Height: 5\' 8"  (1.727 m)     Body mass index is 28.55 kg/m.   Physical Exam:   Physical Exam  Constitutional: He is oriented to person, place, and time.  He appears well-developed and well-nourished. No distress.  HENT:  Head: Normocephalic and atraumatic.  Right Ear: External ear normal.  Left Ear: External ear normal.  Nose: Nose normal.  Mouth/Throat: Oropharynx is clear and moist.  Eyes: Pupils are equal, round, and reactive to light. Conjunctivae and EOM are normal.  Neck: Normal range of motion. Neck supple.  Cardiovascular: Normal rate, regular rhythm, normal heart sounds and intact distal pulses.   Pulmonary/Chest: Effort normal and breath sounds normal.  Abdominal: Soft. Bowel sounds are normal.  Musculoskeletal: Normal range of motion.  Neurological: He is alert and oriented  to person, place, and time.  Skin: Skin is warm and dry.  Rosacea of face.  Psychiatric: He has a normal mood and affect. His behavior is normal. Judgment and thought content normal.  Nursing note and vitals reviewed.  Assessment and Plan:   Farmer was seen today for establish care.  Diagnoses and all orders for this visit:  Acquired hypothyroidism Comments: Recheck labs. Patient wants to stay on Armour Thyroid. He is aware of the risks.  Orders: -     thyroid (ARMOUR) 240 MG tablet; Take 1 tablet (240 mg total) by mouth daily. -     thyroid (ARMOUR) 30 MG tablet; Take 1 tablet (30 mg total) by mouth daily before breakfast. -     Thyroid Peroxidase Antibody -     Thyroid Panel With TSH  Rosacea Comments: Contact reaction to Metrogel. Will DC. Okay trial low dose Doxycycline. Warmed patient about photosensitivity. Gentle skin care reviewed.  Orders: -     doxycycline (VIBRAMYCIN) 50 MG capsule; Take 1 capsule (50 mg total) by mouth daily. -     Thyroid Peroxidase Antibody -     Thyroid Panel With TSH  Fatigue, unspecified type -     CBC with Differential/Platelet -     Comprehensive metabolic panel -     Thyroid Peroxidase Antibody -     Thyroid Panel With TSH -     Insulin, Free (Bioactive); Future -     Testos,Total,Free and SHBG (Male); Future  Pure hypercholesterolemia Comments: Recheck labs.  Orders: -     CBC with Differential/Platelet -     Comprehensive metabolic panel -     Thyroid Peroxidase Antibody -     Thyroid Panel With TSH -     Lipid panel; Future  Essential hypertension Comments: Continue current treatment.  Orders: -     CBC with Differential/Platelet -     Comprehensive metabolic panel -     Thyroid Peroxidase Antibody -     Thyroid Panel With TSH  Need for immunization against influenza -     Flu Vaccine QUAD 36+ mos IM  Atopic dermatitis, unspecified type Comments: Continue prn corticosteroid cream/ointment. Gentle skin care  reviewed.  Seasonal allergic rhinitis due to pollen Comments: Continue current treatment.    . Reviewed expectations re: course of current medical issues. . Discussed self-management of symptoms. . Outlined signs and symptoms indicating need for more acute intervention. . Patient verbalized understanding and all questions were answered. Marland Kitchen Health Maintenance issues including appropriate healthy diet, exercise, and smoking avoidance were discussed with patient. . See orders for this visit as documented in the electronic medical record. . Patient received an After Visit Summary.  CMA served as Neurosurgeon during this visit. History, Physical, and Plan performed by medical provider. The above documentation has been reviewed and is accurate and complete. Helane Rima, D.O.  Helane Rima, DO [place],  [place] 03/29/2017

## 2017-03-30 LAB — THYROID PANEL WITH TSH
Free Thyroxine Index: 2.3 (ref 1.4–3.8)
T3 Uptake: 30 % (ref 22–35)
T4, Total: 7.5 ug/dL (ref 4.9–10.5)
TSH: 0.13 mIU/L — ABNORMAL LOW (ref 0.40–4.50)

## 2017-03-30 LAB — THYROID PEROXIDASE ANTIBODY: Thyroperoxidase Ab SerPl-aCnc: 347 IU/mL — ABNORMAL HIGH (ref <9)

## 2017-04-02 ENCOUNTER — Encounter: Payer: Self-pay | Admitting: Family Medicine

## 2017-04-02 ENCOUNTER — Other Ambulatory Visit (INDEPENDENT_AMBULATORY_CARE_PROVIDER_SITE_OTHER): Payer: BC Managed Care – PPO

## 2017-04-02 DIAGNOSIS — R5383 Other fatigue: Secondary | ICD-10-CM

## 2017-04-02 DIAGNOSIS — E7800 Pure hypercholesterolemia, unspecified: Secondary | ICD-10-CM | POA: Diagnosis not present

## 2017-04-02 LAB — LIPID PANEL
Cholesterol: 92 mg/dL (ref 0–200)
HDL: 41.4 mg/dL (ref >39.00)
LDL Cholesterol: 31 mg/dL (ref 0–99)
NonHDL: 50.55
Total CHOL/HDL Ratio: 2
Triglycerides: 97 mg/dL (ref 0.0–149.0)
VLDL: 19.4 mg/dL (ref 0.0–40.0)

## 2017-04-02 NOTE — Addendum Note (Signed)
Addended by: Helane RimaWALLACE, Brysten Reister R on: 04/02/2017 07:43 AM   Modules accepted: Orders

## 2017-04-05 ENCOUNTER — Other Ambulatory Visit: Payer: Self-pay | Admitting: Surgical

## 2017-04-05 DIAGNOSIS — E7800 Pure hypercholesterolemia, unspecified: Secondary | ICD-10-CM

## 2017-04-05 MED ORDER — FENOFIBRATE 54 MG PO TABS: 54.0000 mg | ORAL_TABLET | Freq: Every day | ORAL | 1 refills | Status: DC

## 2017-04-06 ENCOUNTER — Encounter: Payer: Self-pay | Admitting: Family Medicine

## 2017-04-07 LAB — TESTOS,TOTAL,FREE AND SHBG (FEMALE)
Free Testosterone: 71.3 pg/mL (ref 35.0–155.0)
Sex Hormone Binding: 30 nmol/L (ref 10–50)
Testosterone, Total, LC-MS-MS: 463 ng/dL (ref 250–1100)

## 2017-04-07 LAB — INSULIN, FREE (BIOACTIVE): Insulin, Free: 7.4 u[IU]/mL (ref 1.5–14.9)

## 2017-05-31 ENCOUNTER — Ambulatory Visit (INDEPENDENT_AMBULATORY_CARE_PROVIDER_SITE_OTHER): Payer: BC Managed Care – PPO | Admitting: Family Medicine

## 2017-05-31 ENCOUNTER — Encounter: Payer: Self-pay | Admitting: Family Medicine

## 2017-05-31 VITALS — BP 106/72 | HR 86 | Temp 98.5°F | Ht 68.0 in | Wt 184.0 lb

## 2017-05-31 DIAGNOSIS — Z0000 Encounter for general adult medical examination without abnormal findings: Secondary | ICD-10-CM

## 2017-05-31 DIAGNOSIS — L719 Rosacea, unspecified: Secondary | ICD-10-CM | POA: Diagnosis not present

## 2017-05-31 DIAGNOSIS — L290 Pruritus ani: Secondary | ICD-10-CM

## 2017-05-31 DIAGNOSIS — L309 Dermatitis, unspecified: Secondary | ICD-10-CM

## 2017-05-31 DIAGNOSIS — E039 Hypothyroidism, unspecified: Secondary | ICD-10-CM

## 2017-05-31 DIAGNOSIS — E559 Vitamin D deficiency, unspecified: Secondary | ICD-10-CM

## 2017-05-31 LAB — COMPREHENSIVE METABOLIC PANEL
ALT: 31 U/L (ref 0–53)
AST: 20 U/L (ref 0–37)
Albumin: 4.8 g/dL (ref 3.5–5.2)
Alkaline Phosphatase: 40 U/L (ref 39–117)
BUN: 17 mg/dL (ref 6–23)
CO2: 28 mEq/L (ref 19–32)
Calcium: 9.4 mg/dL (ref 8.4–10.5)
Chloride: 106 mEq/L (ref 96–112)
Creatinine, Ser: 0.9 mg/dL (ref 0.40–1.50)
GFR: 101.31 mL/min (ref >60.00)
Glucose, Bld: 109 mg/dL — ABNORMAL HIGH (ref 70–99)
Potassium: 4.4 mEq/L (ref 3.5–5.1)
Sodium: 140 mEq/L (ref 135–145)
Total Bilirubin: 0.7 mg/dL (ref 0.2–1.2)
Total Protein: 7.3 g/dL (ref 6.0–8.3)

## 2017-05-31 LAB — TSH: TSH: 7.62 u[IU]/mL — ABNORMAL HIGH (ref 0.35–4.50)

## 2017-05-31 LAB — VITAMIN D 25 HYDROXY (VIT D DEFICIENCY, FRACTURES): VITD: 34.83 ng/mL (ref 30.00–100.00)

## 2017-05-31 LAB — T4, FREE: Free T4: 0.55 ng/dL — ABNORMAL LOW (ref 0.60–1.60)

## 2017-05-31 MED ORDER — CRISABOROLE 2 % EX OINT: 1.0000 "application " | TOPICAL_OINTMENT | Freq: Two times a day (BID) | CUTANEOUS | 3 refills | Status: DC | PRN

## 2017-05-31 NOTE — Addendum Note (Signed)
Addended by: Helane RimaWALLACE, Jailine Lieder R on: 05/31/2017 12:33 PM   Modules accepted: Orders

## 2017-05-31 NOTE — Progress Notes (Signed)
Subjective:    Gregory Jennings is a 36 y.o. male who presents today for his Complete Annual Exam. He feels well. He reports exercising regularly. He reports he is sleeping fairly well.   See Assessment and Plan section for Problem Based Charting of other issues discussed today.  PMHx, SurgHx, SocialHx, Medications, and Allergies were reviewed in the Visit Navigator and updated as appropriate.   Past Medical History:  Diagnosis Date  . Acquired hypothyroidism 03/29/2017  . Atopic dermatitis 03/29/2017  . Essential hypertension 03/29/2017  . Hypertension   . Nicotine dependence 12/27/2012  . Pure hypercholesterolemia 03/29/2017  . Rosacea 03/29/2017  . Seasonal allergic rhinitis due to pollen 03/29/2017   No past surgical history on file.  Family History  Problem Relation Age of Onset  . Pancreatitis Mother   . Hypertension Father   . Colon cancer Maternal Grandfather    Social History   Tobacco Use  . Smoking status: Former Smoker    Last attempt to quit: 07/02/2008    Years since quitting: 8.9  . Smokeless tobacco: Never Used  Substance Use Topics  . Alcohol use: Yes    Alcohol/week: 0.0 oz    Comment: Stopped drinking 5 weeks ago  . Drug use: No   Review of Systems:   Pertinent items are noted in the HPI. Otherwise, ROS is negative.  Objective:   Vitals:   05/31/17 0722  BP: 106/72  Pulse: 86  Temp: 98.5 F (36.9 C)  SpO2: 96%   Body mass index is 27.98 kg/m.  General Appearance:  Alert, cooperative, no distress, appears stated age  Head:  Normocephalic, without obvious abnormality, atraumatic  Eyes:  PERRL, conjunctiva/corneas clear, EOM's intact, fundi benign, both eyes       Ears:  Normal TM's and external ear canals, both ears  Nose: Nares normal, septum midline, mucosa normal, no drainage    or sinus tenderness  Throat: Lips, mucosa, and tongue normal; teeth and gums normal  Neck: Supple, symmetrical, trachea midline, no adenopathy; thyroid:  No  enlargement/tenderness/nodules; no carotit bruit or JVD  Back:   Symmetric, no curvature, ROM normal, no CVA tenderness  Lungs:   Clear to auscultation bilaterally, respirations unlabored  Chest wall:  No tenderness or deformity  Heart:  Regular rate and rhythm, S1 and S2 normal, no murmur, rub   or gallop  Abdomen:   Soft, non-tender, bowel sounds active all four quadrants, no masses, no organomegaly  Extremities: Extremities normal, atraumatic, no cyanosis or edema  Prostate: Not done.   Skin: Skin color, texture, turgor normal, diffuse eczema, esp face  Lymph nodes: Cervical, supraclavicular, and axillary nodes normal  Neurologic: CNII-XII grossly intact. Normal strength, sensation and reflexes throughout   Assessment/Plan:   Diagnoses and all orders for this visit:  Routine physical examination  Rosacea Comments: Patient does not feel that they recently prescribed doxycycline has been helpful.  We discussed other options, including topical antibiotics.  Since his atopic dermatitis is more problematic, we will focus on the first. Orders: -     Comprehensive metabolic panel  Acquired hypothyroidism Comments: Patient's Armour Thyroid was decreased after his last visit.  He feels well overall but endorses some "brain fog."  He is due for a recheck of this lab. Orders: -     TSH -     T4, free -     T3, reverse  Eczema of face Comments: Patient endorses dry, patchy, itchy areas on bilateral ears, eyebrows, and upper eyelids.  We discussed seborrheic dermatitis after the last visit.  He used small amounts of steroids combined with thick sensitive creams.  He has had no improvement.  Unfortunately, he has developed increased pressure in his right eye.  This is now being monitored.  We will avoid any steroids in the future. Orders: -     Crisaborole (EUCRISA) 2 % OINT; Apply 1 application topically 2 (two) times daily as needed.  Pruritus ani Comments: Patient does endorse general  anal itching intermittently.  Exam was normal today.  We discussed the possibility of pinworms and treatment for this.  If he is having no improvement in symptoms, this is likely pruritis ani.  See AVS.  Vitamin D deficiency Comments: Due for recheck. Orders: -     VITAMIN D 25 Hydroxy (Vit-D Deficiency, Fractures)   Patient Counseling: [x]   Nutrition: Stressed importance of moderation in sodium/caffeine intake, saturated fat and cholesterol, caloric balance, sufficient intake of fresh fruits, vegetables, and fiber.  [x]   Stressed the importance of regular exercise.   []   Substance Abuse: Discussed cessation/primary prevention of tobacco, alcohol, or other drug use; driving or other dangerous activities under the influence; availability of treatment for abuse.   [x]   Injury prevention: Discussed safety belts, safety helmets, smoke detector, smoking near bedding or upholstery.   []   Sexuality: Discussed sexually transmitted diseases, partner selection, use of condoms, avoidance of unintended pregnancy and contraceptive alternatives.   [x]   Dental health: Discussed importance of regular tooth brushing, flossing, and dental visits.  [x]   Health maintenance and immunizations reviewed. Please refer to Health maintenance section.    Helane RimaErica Kahla Risdon, DO [place] [facility]

## 2017-05-31 NOTE — Patient Instructions (Addendum)
Anal Pruritus Anal pruritus is an itchy feeling in the anus and the skin in the anal area. This is common and can be caused by many things. It often occurs when the area becomes moist. Moisture may be due to sweating or a small amount of stool (feces) that is left on the area because of poor personal cleaning. Some other causes include:  Perfumed soaps and sprays.  Colored toilet paper.  Chemicals in the foods that you eat.  Dietary factors, such as caffeine, beer, milk products, chocolate, nuts, citrus fruits, tomatoes, spicy seasonings, jalapeno peppers, and salsa.  Hemorrhoids, fissures, infections, and other anal diseases.  Excessive washing.  Overuse of laxatives.  Skin disorders (psoriasis, eczema, or seborrhea).  Some medical disorders, such as diabetes or thyroid problems.  Diarrhea.  STDs (sexually transmitted diseases).  Some cancers.  In many cases, the cause is not known. The itching usually goes away with treatment and home care. Scratching can cause further skin damage. Follow these instructions at home: Pay attention to any changes in your symptoms. Take these actions to help with your itching: Skin Care  Practice good hygiene. ? Clean the anal area gently with wet toilet paper, baby wipes, or a wet washcloth after every bowel movement and at bedtime. ? Avoid using soaps on the anal area. ? Dry the area thoroughly. Pat the area dry with toilet paper or a towel.  Do not scrub the anal area with anything, including toilet paper.  Do not scratch the itchy area. Scratching produces more damage and makes the itching worse.  Take sitz baths in warm water as told by your health care provider. Pat the area dry with a soft cloth after each bath.  Use creams or ointments as told by your health care provider. Zinc oxide ointment or a moisture barrier cream can be applied several times per day to protect the skin.  Do not use anything that irritates the skin, such as  bubble baths, scented toilet paper, or genital deodorants. General instructions  Take over-the-counter and prescription medicines only as told by your health care provider.  Talk with your health care provider about fiber supplements. These are helpful in keeping your stool normal if you have frequent loose stools.  Wear cotton underwear and loose clothing.  Keep all follow-up visits as told by your health care provider. This is important. Contact a health care provider if:  Your itching does not improve in several days.  Your itching gets worse.  You have a fever.  You have redness, swelling, or pain in the anal area.  You have fluid, blood, or pus coming from the anal area. This information is not intended to replace advice given to you by your health care provider. Make sure you discuss any questions you have with your health care provider. Document Released: 11/28/2010 Document Revised: 11/04/2015 Document Reviewed: 08/24/2014 Elsevier Interactive Patient Education  2018 Elsevier Inc.  

## 2017-06-01 ENCOUNTER — Encounter: Payer: Self-pay | Admitting: Family Medicine

## 2017-06-01 ENCOUNTER — Telehealth: Payer: Self-pay

## 2017-06-01 ENCOUNTER — Other Ambulatory Visit: Payer: Self-pay | Admitting: Surgical

## 2017-06-01 DIAGNOSIS — L719 Rosacea, unspecified: Secondary | ICD-10-CM

## 2017-06-01 NOTE — Telephone Encounter (Signed)
See patient's follow up question below /

## 2017-06-01 NOTE — Telephone Encounter (Signed)
Auth received. Effective 05/02/17 to 08/30/2017 Pharmacy informed. approval sent to scan. Per pharmacy it is going to deductible and cost is going to be over $500. Called and l/m to let patient know.

## 2017-06-01 NOTE — Telephone Encounter (Signed)
Auth started on cover my meds  Key [id number]

## 2017-06-01 NOTE — Telephone Encounter (Signed)
Copied from CRM [id number]. Topic: Quick Communication - Rx Refill/Question >> Jun 01, 2017  2:50 PM Darletta MollLander, Lumin L wrote: Has the patient contacted their pharmacy? Yes.   (Agent: If no, request that the patient contact the pharmacy for the refill.) Preferred Pharmacy (with phone number or street name): [facility] - [place], [place] - [address] Agent: Please be advised that RX refills may take up to 3 business days. We ask that you follow-up with your pharmacy. Patient wants to go ahead and start thyroid (ARMOUR) 30 MG tablet. Is there an alternative med to Time WarnerCrisaborole (EUCRISA) 2 % OINT?? Please advise.

## 2017-06-01 NOTE — Telephone Encounter (Signed)
Please advise 

## 2017-06-02 NOTE — Telephone Encounter (Signed)
Will address with patient vis myChart.

## 2017-06-03 LAB — T3, REVERSE: T3, Reverse: 8 ng/dL (ref 8–25)

## 2017-06-04 MED ORDER — TACROLIMUS 0.03 % EX OINT: TOPICAL_OINTMENT | Freq: Two times a day (BID) | CUTANEOUS | 0 refills | Status: DC

## 2017-06-06 MED ORDER — THYROID 130 MG PO TABS: 130.0000 mg | ORAL_TABLET | Freq: Every day | ORAL | 0 refills | Status: DC

## 2017-06-06 NOTE — Addendum Note (Signed)
Addended by: Helane RimaWALLACE, [place] Mcphee R on: 06/06/2017 02:56 PM   Modules accepted: Orders

## 2017-06-11 ENCOUNTER — Other Ambulatory Visit: Payer: Self-pay | Admitting: Surgical

## 2017-06-11 MED ORDER — THYROID 240 MG PO TABS: ORAL_TABLET | ORAL | 3 refills | Status: DC

## 2017-07-09 ENCOUNTER — Encounter: Payer: Self-pay | Admitting: Family Medicine

## 2017-07-12 ENCOUNTER — Other Ambulatory Visit: Payer: Self-pay

## 2017-07-12 DIAGNOSIS — E039 Hypothyroidism, unspecified: Secondary | ICD-10-CM

## 2017-07-18 ENCOUNTER — Ambulatory Visit: Payer: BC Managed Care – PPO | Admitting: Family Medicine

## 2017-07-18 ENCOUNTER — Encounter: Payer: Self-pay | Admitting: Family Medicine

## 2017-07-18 ENCOUNTER — Ambulatory Visit: Payer: Self-pay

## 2017-07-18 ENCOUNTER — Other Ambulatory Visit (INDEPENDENT_AMBULATORY_CARE_PROVIDER_SITE_OTHER): Payer: BC Managed Care – PPO

## 2017-07-18 VITALS — BP 118/80 | HR 77 | Ht 68.0 in | Wt 180.0 lb

## 2017-07-18 DIAGNOSIS — E039 Hypothyroidism, unspecified: Secondary | ICD-10-CM | POA: Diagnosis not present

## 2017-07-18 DIAGNOSIS — M7918 Myalgia, other site: Secondary | ICD-10-CM | POA: Diagnosis not present

## 2017-07-18 DIAGNOSIS — M25522 Pain in left elbow: Secondary | ICD-10-CM | POA: Diagnosis not present

## 2017-07-18 LAB — T4, FREE: Free T4: 0.67 ng/dL (ref 0.60–1.60)

## 2017-07-18 LAB — TSH: TSH: 0.62 u[IU]/mL (ref 0.35–4.50)

## 2017-07-18 MED ORDER — VITAMIN D (ERGOCALCIFEROL) 1.25 MG (50000 UNIT) PO CAPS: 50000.0000 [IU] | ORAL_CAPSULE | ORAL | 0 refills | Status: DC

## 2017-07-18 NOTE — Patient Instructions (Signed)
Good to see you  Gustavus Bryantce is your friend. Ice 20 minutes 2 times daily. Usually after activity and before bed. Compression sleeve with a lot of activity  pennsaid pinkie amount topically 2 times daily as needed.  Try to lift thumbs up or overhand for now.  Once weekly vitamin D for 12 weeks See me again in 4 week s

## 2017-07-18 NOTE — Progress Notes (Addendum)
[name] [address] [place], [place] [address] Phone: [phone] Subjective:     CC: Left arm pain  BJY:[id number]  Gregory Jennings is a 37 y.o. male coming in for left elbow pain in the the anterior portion of elbow. He said that he has pain with elbow flexion. He has not been working out due to the pain which is achy. He tries to avoid using this arm to alleviate his pain.      Past Medical History:  Diagnosis Date  . Acquired hypothyroidism 03/29/2017  . Atopic dermatitis 03/29/2017  . Essential hypertension 03/29/2017  . Hypertension   . Nicotine dependence 12/27/2012  . Pure hypercholesterolemia 03/29/2017  . Rosacea 03/29/2017  . Seasonal allergic rhinitis due to pollen 03/29/2017   No past surgical history on file. Social History   Socioeconomic History  . Marital status: Married    Spouse name: Not on file  . Number of children: Not on file  . Years of education: Not on file  . Highest education level: Not on file  Social Needs  . Financial resource strain: Not on file  . Food insecurity - worry: Not on file  . Food insecurity - inability: Not on file  . Transportation needs - medical: Not on file  . Transportation needs - non-medical: Not on file  Occupational History  . Occupation: Magazine features editor: [employer]  Tobacco Use  . Smoking status: Former Smoker    Last attempt to quit: 07/02/2008    Years since quitting: 9.0  . Smokeless tobacco: Never Used  Substance and Sexual Activity  . Alcohol use: Yes    Alcohol/week: 0.0 oz    Comment: Stopped drinking 5 weeks ago  . Drug use: No  . Sexual activity: Yes    Partners: Female  Other Topics Concern  . Not on file  Social History Narrative  . Not on file   No Known Allergies Family History  Problem Relation Age of Onset  . Pancreatitis Mother   . Hypertension Father   . Colon cancer Maternal Grandfather      Past medical history, social, surgical and family  history all reviewed in electronic medical record.  No pertanent information unless stated regarding to the chief complaint.   Review of Systems:Review of systems updated and as accurate as of 07/18/17  No headache, visual changes, nausea, vomiting, diarrhea, constipation, dizziness, abdominal pain, skin rash, fevers, chills, night sweats, weight loss, swollen lymph nodes, body aches, joint swelling, muscle aches, chest pain, shortness of breath, mood changes.   Objective  There were no vitals taken for this visit. Systems examined below as of 07/18/17   General: No apparent distress alert and oriented x3 mood and affect normal, dressed appropriately.  HEENT: Pupils equal, extraocular movements intact  Respiratory: Patient's speak in full sentences and does not appear short of breath  Cardiovascular: No lower extremity edema, non tender, no erythema  Skin: Warm dry intact with no signs of infection or rash on extremities or on axial skeleton.  Abdomen: Soft nontender  Neuro: Cranial nerves II through XII are intact, neurovascularly intact in all extremities with 2+ DTRs and 2+ pulses.  Lymph: No lymphadenopathy of posterior or anterior cervical chain or axillae bilaterally.  Gait normal with good balance and coordination.  MSK:  Non tender with full range of motion and good stability and symmetric strength and tone of   others, wrist, hip, knee and ankles bilaterally.  Left elbow shows the patient does have some positive pain with speeds.  Some mild discomfort at the insertion of the brachial radialis.  Some pain to compression of the median nerve as well.  Negative Tinel sign.  Full range of motion of the elbow.  Good strength.  Limited muscular skeletal ultrasound was performed and interpreted by Judi SaaZachary M Smith  Limited ultrasound of patient's left elbow shows the patient does have scar tissue formation at the brachial radialis tendon muscular juncture.  Patient is married does have  unfortunately some scarring seems to cause some dilatation of the radial nerve. 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  Verbal informed consent obtained.  Time-out conducted.  Noted no overlying erythema, induration, or other signs of local infection.  Skin prepped in a sterile fashion.  Local anesthesia: Topical Ethyl chloride.  With sterile technique and under real time ultrasound guidance: With a 25-gauge half inch needle was injected with 0.5 cc of 0.5% Marcaine and 0.5 cc of Kenalog 40 mg/mL within the brachial radialis tendon sheath. Completed without difficulty  Pain immediately resolved suggesting accurate placement of the medication.  Advised to call if fevers/chills, erythema, induration, drainage, or persistent bleeding.  Images permanently stored and available for review in the ultrasound unit.  Impression: Technically successful ultrasound guided injection.   Impression and Recommendations:     This case required medical decision making of moderate complexity.      Note: This dictation was prepared with Dragon dictation along with smaller phrase technology. Any transcriptional errors that result from this process are unintentional.

## 2017-07-18 NOTE — Assessment & Plan Note (Signed)
I believe the patient did have more of a median nerve impingement secondary to scar tissue formation within the brachial radialis muscle.  Given injection and tolerated the procedure well.  Do which wants to avoid.  Patient will do a compression sleeve.  Patient responded well to this.  Follow-up again in 4-6 weeks

## 2017-07-19 ENCOUNTER — Encounter: Payer: Self-pay | Admitting: Family Medicine

## 2017-07-19 LAB — T3: T3, Total: 95 ng/dL (ref 76–181)

## 2017-10-02 ENCOUNTER — Encounter: Payer: Self-pay | Admitting: Family Medicine

## 2017-10-09 ENCOUNTER — Other Ambulatory Visit: Payer: Self-pay | Admitting: Surgical

## 2017-10-09 DIAGNOSIS — E039 Hypothyroidism, unspecified: Secondary | ICD-10-CM

## 2017-10-11 ENCOUNTER — Other Ambulatory Visit (INDEPENDENT_AMBULATORY_CARE_PROVIDER_SITE_OTHER): Payer: BC Managed Care – PPO

## 2017-10-11 DIAGNOSIS — E039 Hypothyroidism, unspecified: Secondary | ICD-10-CM

## 2017-10-11 LAB — T3, FREE: T3, Free: 4.4 pg/mL — ABNORMAL HIGH (ref 2.3–4.2)

## 2017-10-11 LAB — T4, FREE: Free T4: 0.82 ng/dL (ref 0.60–1.60)

## 2017-10-11 LAB — TSH: TSH: 0.19 u[IU]/mL — ABNORMAL LOW (ref 0.35–4.50)

## 2017-10-12 LAB — THYROID PEROXIDASE ANTIBODY: Thyroperoxidase Ab SerPl-aCnc: 237 IU/mL — ABNORMAL HIGH (ref <9)

## 2017-10-14 LAB — T3, REVERSE: T3, Reverse: 12 ng/dL (ref 8–25)

## 2017-10-15 ENCOUNTER — Telehealth: Payer: Self-pay | Admitting: Family Medicine

## 2017-10-15 NOTE — Telephone Encounter (Unsigned)
Copied from CRM [id number]. Topic: Quick Communication - Lab Results >> Oct 15, 2017 12:03 PM Percival Spanish wrote:  Pt returning call for lab results   [phone]

## 2017-10-16 NOTE — Telephone Encounter (Signed)
Left message to call back on voice mail. See result basket.

## 2017-10-17 ENCOUNTER — Encounter: Payer: Self-pay | Admitting: Surgical

## 2017-10-17 ENCOUNTER — Telehealth: Payer: Self-pay | Admitting: Family Medicine

## 2017-10-17 DIAGNOSIS — E039 Hypothyroidism, unspecified: Secondary | ICD-10-CM

## 2017-10-17 NOTE — Addendum Note (Signed)
Addended by: Dorian Pod on: 10/17/2017 02:47 PM   Modules accepted: Orders

## 2017-10-17 NOTE — Telephone Encounter (Signed)
Referral has been placed. I will send patient a my chart message.

## 2017-10-17 NOTE — Telephone Encounter (Signed)
Sounds good

## 2017-10-17 NOTE — Telephone Encounter (Signed)
OK for referral?  

## 2017-10-17 NOTE — Telephone Encounter (Signed)
See note.  Copied from CRM [id number]. Topic: Referral - Request >> Oct 17, 2017 12:15 PM Alexander Bergeron B wrote: Reason for CRM: pt called to request endocrinologist Dr. Talmage Coin, call pt to advise if needed

## 2017-10-23 ENCOUNTER — Encounter: Payer: Self-pay | Admitting: Family Medicine

## 2017-10-25 NOTE — Telephone Encounter (Signed)
Pt calling to f/u on referral to Endo. He states he didn't want to go to [facility] because they do no have positive reviews. He has not heard from Dr. Tonita Cong office which is why he is requesting a referral to [facility]. He states that he understands it is challenging to get "quick" appts with Endo but would like to know who we can get him in with the soonest (other than [facility]). Pt call back # [phone].

## 2017-12-05 LAB — TSH: TSH: 0.22 — AB (ref 0.41–5.90)

## 2018-03-19 ENCOUNTER — Other Ambulatory Visit: Payer: Self-pay | Admitting: Surgical

## 2018-03-19 ENCOUNTER — Encounter: Payer: Self-pay | Admitting: Family Medicine

## 2018-03-19 ENCOUNTER — Encounter: Payer: BC Managed Care – PPO | Admitting: Family Medicine

## 2018-03-19 ENCOUNTER — Ambulatory Visit (INDEPENDENT_AMBULATORY_CARE_PROVIDER_SITE_OTHER): Payer: BC Managed Care – PPO | Admitting: Family Medicine

## 2018-03-19 VITALS — BP 110/76 | HR 65 | Temp 97.9°F | Ht 68.0 in | Wt 162.2 lb

## 2018-03-19 DIAGNOSIS — Z23 Encounter for immunization: Secondary | ICD-10-CM | POA: Diagnosis not present

## 2018-03-19 DIAGNOSIS — Z13220 Encounter for screening for lipoid disorders: Secondary | ICD-10-CM | POA: Diagnosis not present

## 2018-03-19 DIAGNOSIS — I10 Essential (primary) hypertension: Secondary | ICD-10-CM

## 2018-03-19 DIAGNOSIS — Z0000 Encounter for general adult medical examination without abnormal findings: Secondary | ICD-10-CM

## 2018-03-19 DIAGNOSIS — E039 Hypothyroidism, unspecified: Secondary | ICD-10-CM

## 2018-03-19 MED ORDER — THYROID 240 MG PO TABS: ORAL_TABLET | ORAL | 0 refills | Status: DC

## 2018-03-19 NOTE — Progress Notes (Deleted)
Subjective:    Kaizer Dissinger is a 37 y.o. male who presents today for his Complete Annual Exam.    Current Outpatient Medications:  .  clobetasol (TEMOVATE) 0.05 % GEL, Apply topically 2 (two) times daily., Disp: , Rfl:  .  Crisaborole (EUCRISA) 2 % OINT, Apply 1 application topically 2 (two) times daily as needed., Disp: 60 g, Rfl: 3 .  doxycycline (VIBRAMYCIN) 50 MG capsule, Take 1 capsule (50 mg total) by mouth daily., Disp: 90 capsule, Rfl: 3 .  fenofibrate 54 MG tablet, Take 1 tablet (54 mg total) by mouth daily., Disp: 90 tablet, Rfl: 1 .  fluticasone (FLONASE) 50 MCG/ACT nasal spray, Place into both nostrils daily., Disp: , Rfl:  .  hydrocortisone cream 0.5 %, Apply 1 application topically 2 (two) times daily., Disp: , Rfl:  .  losartan (COZAAR) 100 MG tablet, Take 100 mg by mouth daily., Disp: , Rfl:  .  rosuvastatin (CRESTOR) 5 MG tablet, Take 5 mg by mouth daily., Disp: , Rfl:  .  tacrolimus (PROTOPIC) 0.03 % ointment, Apply topically 2 (two) times daily., Disp: 100 g, Rfl: 0 .  thyroid (ARMOUR THYROID) 240 MG tablet, Please take half tablet daily in the morning., Disp: 30 tablet, Rfl: 3 .  thyroid (ARMOUR) 130 MG tablet, Take 1 tablet (130 mg total) by mouth daily., Disp: 90 tablet, Rfl: 0 .  Vitamin D, Ergocalciferol, (DRISDOL) 50000 units CAPS capsule, Take 1 capsule (50,000 Units total) by mouth every 7 (seven) days., Disp: 12 capsule, Rfl: 0  Health Maintenance Due  Topic Date Due  . INFLUENZA VACCINE  01/10/2018    PMHx, SurgHx, SocialHx, Medications, and Allergies were reviewed in the Visit Navigator and updated as appropriate.   Past Medical History:  Diagnosis Date  . Acquired hypothyroidism 03/29/2017  . Atopic dermatitis 03/29/2017  . Essential hypertension 03/29/2017  . Hypertension   . Nicotine dependence 12/27/2012  . Pure hypercholesterolemia 03/29/2017  . Rosacea 03/29/2017  . Seasonal allergic rhinitis due to pollen 03/29/2017    No past surgical  history on file.  Family History  Problem Relation Age of Onset  . Pancreatitis Mother   . Hypertension Father   . Colon cancer Maternal Grandfather     Social History   Tobacco Use  . Smoking status: Former Smoker    Last attempt to quit: 07/02/2008    Years since quitting: 9.7  . Smokeless tobacco: Never Used  Substance Use Topics  . Alcohol use: Yes    Alcohol/week: 0.0 standard drinks    Comment: Stopped drinking 5 weeks ago  . Drug use: No    Review of Systems:   Pertinent items are noted in the HPI. Otherwise, ROS is negative.  Objective:   There were no vitals filed for this visit. There is no height or weight on file to calculate BMI.  General Appearance:  Alert, cooperative, no distress, appears stated age  Head:  Normocephalic, without obvious abnormality, atraumatic  Eyes:  PERRL, conjunctiva/corneas clear, EOM's intact, fundi benign, both eyes       Ears:  Normal TM's and external ear canals, both ears  Nose: Nares normal, septum midline, mucosa normal, no drainage    or sinus tenderness  Throat: Lips, mucosa, and tongue normal; teeth and gums normal  Neck: Supple, symmetrical, trachea midline, no adenopathy; thyroid:  No enlargement/tenderness/nodules; no carotit bruit or JVD  Back:   Symmetric, no curvature, ROM normal, no CVA tenderness  Lungs:   Clear to auscultation  bilaterally, respirations unlabored  Chest wall:  No tenderness or deformity  Heart:  Regular rate and rhythm, S1 and S2 normal, no murmur, rub   or gallop  Abdomen:   Soft, non-tender, bowel sounds active all four quadrants, no masses, no organomegaly  Extremities: Extremities normal, atraumatic, no cyanosis or edema  Prostate: Not done.   Skin: Skin color, texture, turgor normal, no rashes or lesions  Lymph nodes: Cervical, supraclavicular, and axillary nodes normal  Neurologic: CNII-XII grossly intact. Normal strength, sensation and reflexes throughout    Assessment/Plan:   There are  no diagnoses linked to this encounter.  Patient Counseling: [x]   Nutrition: Stressed importance of moderation in sodium/caffeine intake, saturated fat and cholesterol, caloric balance, sufficient intake of fresh fruits, vegetables, and fiber.  [x]   Stressed the importance of regular exercise.   []   Substance Abuse: Discussed cessation/primary prevention of tobacco, alcohol, or other drug use; driving or other dangerous activities under the influence; availability of treatment for abuse.   [x]   Injury prevention: Discussed safety belts, safety helmets, smoke detector, smoking near bedding or upholstery.   []   Sexuality: Discussed sexually transmitted diseases, partner selection, use of condoms, avoidance of unintended pregnancy and contraceptive alternatives.   [x]   Dental health: Discussed importance of regular tooth brushing, flossing, and dental visits.  [x]   Health maintenance and immunizations reviewed. Please refer to Health maintenance section.    Helane Rima, DO  [facility]

## 2018-03-19 NOTE — Progress Notes (Signed)
Subjective:    Gregory Jennings is a 37 y.o. male who presents today for his Complete Annual Exam.   Current Outpatient Medications:  .  Cholecalciferol (VITAMIN D) 2000 units CAPS, , Disp: , Rfl:  .  Omega-3 Fatty Acids (FISH OIL) 1000 MG CAPS, , Disp: , Rfl:  .  thyroid (ARMOUR THYROID) 120 MG tablet, Take one tablet daily .  ketoconazole (NIZORAL) 2 % cream, APP TO FACE D, Disp: , Rfl: 1 .  mupirocin ointment (BACTROBAN) 2 %, , Disp: , Rfl:   There are no preventive care reminders to display for this patient.  PMHx, SurgHx, SocialHx, Medications, and Allergies were reviewed in the Visit Navigator and updated as appropriate.   Past Medical History:  Diagnosis Date  . Acquired hypothyroidism 03/29/2017  . Atopic dermatitis 03/29/2017  . Essential hypertension 03/29/2017  . Hypertension   . Nicotine dependence 12/27/2012  . Pure hypercholesterolemia 03/29/2017  . Rosacea 03/29/2017  . Seasonal allergic rhinitis due to pollen 03/29/2017   History reviewed. No pertinent surgical history.   Family History  Problem Relation Age of Onset  . Pancreatitis Mother   . Hypertension Father   . Colon cancer Maternal Grandfather    Social History   Tobacco Use  . Smoking status: Former Smoker    Last attempt to quit: 07/02/2008    Years since quitting: 9.7  . Smokeless tobacco: Never Used  Substance Use Topics  . Alcohol use: Yes    Alcohol/week: 0.0 standard drinks    Comment: Stopped drinking 5 weeks ago  . Drug use: No   Review of Systems:   Pertinent items are noted in the HPI. Otherwise, ROS is negative.  Objective:   Vitals:   03/19/18 1537  BP: 110/76  Pulse: 65  Temp: 97.9 F (36.6 C)  SpO2: 96%   Body mass index is 24.66 kg/m.  General Appearance:  Alert, cooperative, no distress, appears stated age  Head:  Normocephalic, without obvious abnormality, atraumatic  Eyes:  PERRL, conjunctiva/corneas clear, EOM's intact, fundi benign, both eyes       Ears:   Normal TM's and external ear canals, both ears  Nose: Nares normal, septum midline, mucosa normal, no drainage    or sinus tenderness  Throat: Lips, mucosa, and tongue normal; teeth and gums normal  Neck: Supple, symmetrical, trachea midline, no adenopathy; thyroid:  No enlargement/tenderness/nodules; no carotit bruit or JVD  Back:   Symmetric, no curvature, ROM normal, no CVA tenderness  Lungs:   Clear to auscultation bilaterally, respirations unlabored  Chest wall:  No tenderness or deformity  Heart:  Regular rate and rhythm, S1 and S2 normal, no murmur, rub   or gallop  Abdomen:   Soft, non-tender, bowel sounds active all four quadrants, no masses, no organomegaly  Extremities: Extremities normal, atraumatic, no cyanosis or edema  Prostate: Not done.   Skin: Skin color, texture, turgor normal, no rashes or lesions  Lymph nodes: Cervical, supraclavicular, and axillary nodes normal  Neurologic: CNII-XII grossly intact. Normal strength, sensation and reflexes throughout   Assessment/Plan:   Diagnoses and all orders for this visit:  Routine physical examination  Acquired hypothyroidism -     TSH -     T4, free -     T3, free  Encounter for screening for lipid disorder -     Lipid panel  Essential hypertension -     CBC with Differential/Platelet -     Comprehensive metabolic panel  Need for immunization against  influenza -     Flu Vaccine QUAD 36+ mos IM   Patient Counseling: [x]   Nutrition: Stressed importance of moderation in sodium/caffeine intake, saturated fat and cholesterol, caloric balance, sufficient intake of fresh fruits, vegetables, and fiber.  [x]   Stressed the importance of regular exercise.   []   Substance Abuse: Discussed cessation/primary prevention of tobacco, alcohol, or other drug use; driving or other dangerous activities under the influence; availability of treatment for abuse.   [x]   Injury prevention: Discussed safety belts, safety helmets, smoke  detector, smoking near bedding or upholstery.   []   Sexuality: Discussed sexually transmitted diseases, partner selection, use of condoms, avoidance of unintended pregnancy and contraceptive alternatives.   [x]   Dental health: Discussed importance of regular tooth brushing, flossing, and dental visits.  [x]   Health maintenance and immunizations reviewed. Please refer to Health maintenance section.    Helane Rima, DO [place] [facility]

## 2018-03-20 ENCOUNTER — Encounter: Payer: Self-pay | Admitting: Family Medicine

## 2018-03-20 LAB — COMPREHENSIVE METABOLIC PANEL
ALT: 23 U/L (ref 0–53)
AST: 21 U/L (ref 0–37)
Albumin: 4.6 g/dL (ref 3.5–5.2)
Alkaline Phosphatase: 44 U/L (ref 39–117)
BUN: 18 mg/dL (ref 6–23)
CO2: 28 mEq/L (ref 19–32)
Calcium: 9.4 mg/dL (ref 8.4–10.5)
Chloride: 105 mEq/L (ref 96–112)
Creatinine, Ser: 0.89 mg/dL (ref 0.40–1.50)
GFR: 102.17 mL/min (ref >60.00)
Glucose, Bld: 94 mg/dL (ref 70–99)
Potassium: 3.9 mEq/L (ref 3.5–5.1)
Sodium: 140 mEq/L (ref 135–145)
Total Bilirubin: 0.6 mg/dL (ref 0.2–1.2)
Total Protein: 7.2 g/dL (ref 6.0–8.3)

## 2018-03-20 LAB — LIPID PANEL
Cholesterol: 149 mg/dL (ref 0–200)
HDL: 55.5 mg/dL (ref >39.00)
LDL Cholesterol: 73 mg/dL (ref 0–99)
NonHDL: 93.31
Total CHOL/HDL Ratio: 3
Triglycerides: 103 mg/dL (ref 0.0–149.0)
VLDL: 20.6 mg/dL (ref 0.0–40.0)

## 2018-03-20 LAB — CBC WITH DIFFERENTIAL/PLATELET
Basophils Absolute: 0 10*3/uL (ref 0.0–0.1)
Basophils Relative: 0.4 % (ref 0.0–3.0)
Eosinophils Absolute: 0 10*3/uL (ref 0.0–0.7)
Eosinophils Relative: 0.6 % (ref 0.0–5.0)
HCT: 46 % (ref 39.0–52.0)
Hemoglobin: 15.4 g/dL (ref 13.0–17.0)
Lymphocytes Relative: 34.9 % (ref 12.0–46.0)
Lymphs Abs: 1.9 10*3/uL (ref 0.7–4.0)
MCHC: 33.5 g/dL (ref 30.0–36.0)
MCV: 89.1 fl (ref 78.0–100.0)
Monocytes Absolute: 0.5 10*3/uL (ref 0.1–1.0)
Monocytes Relative: 9.7 % (ref 3.0–12.0)
Neutro Abs: 2.9 10*3/uL (ref 1.4–7.7)
Neutrophils Relative %: 54.4 % (ref 43.0–77.0)
Platelets: 284 10*3/uL (ref 150.0–400.0)
RBC: 5.17 Mil/uL (ref 4.22–5.81)
RDW: 13.5 % (ref 11.5–15.5)
WBC: 5.4 10*3/uL (ref 4.0–10.5)

## 2018-03-20 LAB — T3, FREE: T3, Free: 3.3 pg/mL (ref 2.3–4.2)

## 2018-03-20 LAB — TSH: TSH: 1.57 u[IU]/mL (ref 0.35–4.50)

## 2018-03-20 LAB — T4, FREE: Free T4: 0.71 ng/dL (ref 0.60–1.60)

## 2018-05-24 ENCOUNTER — Other Ambulatory Visit: Payer: Self-pay | Admitting: Family Medicine

## 2018-07-19 ENCOUNTER — Other Ambulatory Visit: Payer: Self-pay | Admitting: Family Medicine

## 2018-12-31 ENCOUNTER — Encounter (HOSPITAL_COMMUNITY): Payer: Self-pay

## 2018-12-31 ENCOUNTER — Encounter: Payer: Self-pay | Admitting: Physician Assistant

## 2018-12-31 ENCOUNTER — Ambulatory Visit (INDEPENDENT_AMBULATORY_CARE_PROVIDER_SITE_OTHER): Payer: BC Managed Care – PPO | Admitting: Physician Assistant

## 2018-12-31 ENCOUNTER — Other Ambulatory Visit: Payer: Self-pay

## 2018-12-31 ENCOUNTER — Ambulatory Visit (HOSPITAL_COMMUNITY)
Admission: EM | Admit: 2018-12-31 | Discharge: 2018-12-31 | Disposition: A | Discharge status: Home / Self Care | Payer: BC Managed Care – PPO | Attending: Family Medicine | Admitting: Family Medicine

## 2018-12-31 VITALS — Temp 100.0°F | Ht 68.0 in | Wt 150.0 lb

## 2018-12-31 DIAGNOSIS — J029 Acute pharyngitis, unspecified: Secondary | ICD-10-CM

## 2018-12-31 DIAGNOSIS — R5383 Other fatigue: Secondary | ICD-10-CM | POA: Diagnosis not present

## 2018-12-31 DIAGNOSIS — R52 Pain, unspecified: Secondary | ICD-10-CM

## 2018-12-31 DIAGNOSIS — Z20828 Contact with and (suspected) exposure to other viral communicable diseases: Secondary | ICD-10-CM | POA: Diagnosis not present

## 2018-12-31 DIAGNOSIS — R509 Fever, unspecified: Secondary | ICD-10-CM

## 2018-12-31 DIAGNOSIS — R6889 Other general symptoms and signs: Secondary | ICD-10-CM

## 2018-12-31 DIAGNOSIS — Z20822 Contact with and (suspected) exposure to covid-19: Secondary | ICD-10-CM

## 2018-12-31 LAB — POCT RAPID STREP A: Streptococcus, Group A Screen (Direct): NEGATIVE

## 2018-12-31 MED ORDER — AMOXICILLIN 875 MG PO TABS: 875.0000 mg | ORAL_TABLET | Freq: Two times a day (BID) | ORAL | 0 refills | Status: DC

## 2018-12-31 NOTE — ED Triage Notes (Signed)
Patient presents to Urgent Care with complaints of sore throat, low grade fever, and fatigue since a few days ago. Patient reports his PCP sent him for COVID and strep throat testing.

## 2018-12-31 NOTE — Progress Notes (Signed)
Virtual Visit via Video   I connected with Gregory Jennings on 12/31/18 at  7:40 AM EDT by a video enabled telemedicine application and verified that I am speaking with the correct person using two identifiers. Location patient: Home Location provider: [facility], Office Persons participating in the virtual visit: Michio, Thier PA-C.   I discussed the limitations of evaluation and management by telemedicine and the availability of in person appointments. The patient expressed understanding and agreed to proceed.  I acted as a Education administrator for [employer], PA-C [employer], LPN  Subjective:   HPI:   Sore throat Pt c/o sore throat started a week ago, Friday had fever 102, has been 101 past few days and now today 100, body aches and fatigue. Using Tylenol and Ibuprofen for fever. No cough, minimal congestion, no SOB, diarrhea, loss of taste/smell.  Was tested for COVID prior to a vacation just to make sure he didn't have it, this was back in early July (end of June).  ROS: See pertinent positives and negatives per HPI.  Patient Active Problem List   Diagnosis Date Noted  . Rosacea 03/29/2017  . Acquired hypothyroidism 03/29/2017  . Pure hypercholesterolemia 03/29/2017  . Essential hypertension 03/29/2017  . Atopic dermatitis 03/29/2017  . Seasonal allergic rhinitis due to pollen 03/29/2017  . Nicotine dependence 12/27/2012    Social History   Tobacco Use  . Smoking status: Former Smoker    Quit date: 07/02/2008    Years since quitting: 10.5  . Smokeless tobacco: Never Used  Substance Use Topics  . Alcohol use: Yes    Alcohol/week: 0.0 standard drinks    Comment: Stopped drinking 5 weeks ago    Current Outpatient Medications:  .  Omega-3 Fatty Acids (FISH OIL) 1000 MG CAPS, , Disp: , Rfl:  .  thyroid (ARMOUR THYROID) 240 MG tablet, TAKE 1/2 TABLET BY MOUTH DAILY IN THE MORNING, Disp: 30 tablet, Rfl: 6 .  amoxicillin (AMOXIL) 875 MG tablet, Take 1  tablet (875 mg total) by mouth 2 (two) times daily., Disp: 20 tablet, Rfl: 0 .  ketoconazole (NIZORAL) 2 % cream, APP TO FACE D, Disp: , Rfl: 1  No Known Allergies  Objective:   VITALS: Per patient if applicable, see vitals. GENERAL: Alert, appears well and in no acute distress. HEENT: Atraumatic, conjunctiva clear, no obvious abnormalities on inspection of external nose and ears. NECK: Normal movements of the head and neck. CARDIOPULMONARY: No increased WOB. Speaking in clear sentences. I:E ratio WNL.  MS: Moves all visible extremities without noticeable abnormality. PSYCH: Pleasant and cooperative, well-groomed. Speech normal rate and rhythm. Affect is appropriate. Insight and judgement are appropriate. Attention is focused, linear, and appropriate.  NEURO: CN grossly intact. Oriented as arrived to appointment on time with no prompting. Moves both UE equally.  SKIN: No obvious lesions, wounds, erythema, or cyanosis noted on face or hands.  Assessment and Plan:   Jaleen was seen today for sore throat, generalized body aches and fever.  Diagnoses and all orders for this visit:  Suspected Covid-19 Virus Infection -     Novel Coronavirus, NAA ([facility])  Sore throat  Other orders -     amoxicillin (AMOXIL) 875 MG tablet; Take 1 tablet (875 mg total) by mouth 2 (two) times daily.   Patient has a respiratory illness without signs of acute distress or respiratory compromise at this time. This is likely a viral infection, which can come from a number of respiratory viruses. We are  going to send patient for drive-up testing. As a precaution, they have been advised to remain home until COVID-19 results and then possible further quarantine after that based on results and symptoms. Advised if they experience a "second sickening" or worsening symptoms as the illness progresses, they are to call the office for further instructions or seek emergent evaluation for any severe symptoms.   Also,  cannot rule out strep throat, we are going to empirically start oral amoxicillin.  . Reviewed expectations re: course of current medical issues. . Discussed self-management of symptoms. . Outlined signs and symptoms indicating need for more acute intervention. . Patient verbalized understanding and all questions were answered. Marland Kitchen. Health Maintenance issues including appropriate healthy diet, exercise, and smoking avoidance were discussed with patient. . See orders for this visit as documented in the electronic medical record.  I discussed the assessment and treatment plan with the patient. The patient was provided an opportunity to ask questions and all were answered. The patient agreed with the plan and demonstrated an understanding of the instructions.   The patient was advised to call back or seek an in-person evaluation if the symptoms worsen or if the condition fails to improve as anticipated.   CMA or LPN served as scribe during this visit. History, Physical, and Plan performed by medical provider. The above documentation has been reviewed and is accurate and complete.   [place] Vernon[place]antha Xitlally [place], [place] 12/31/2018

## 2018-12-31 NOTE — ED Provider Notes (Signed)
[facility]    CSN: [id number] Arrival date & time: 12/31/18  [id number]     History   Chief Complaint Chief Complaint  Patient presents with  . Sore Throat  . Fever    HPI Gregory Jennings is a 38 y.o. male.   Patient presents with sore throat, fever T-max 102, generalized fatigue times several days.  He had a tele-visit this morning with his PCP and was given a prescription for amoxicillin; he also had a COVID test arranged.  He has not filled the prescription for the amoxicillin nor has he gone for the COVID test.  He is here today for a rapid strep test and a COVID test.  He denies shortness of breath, cough, vomiting, diarrhea.      The history is provided by the patient.    Past Medical History:  Diagnosis Date  . Acquired hypothyroidism 03/29/2017  . Atopic dermatitis 03/29/2017  . Essential hypertension 03/29/2017  . Hypertension   . Nicotine dependence 12/27/2012  . Pure hypercholesterolemia 03/29/2017  . Rosacea 03/29/2017  . Seasonal allergic rhinitis due to pollen 03/29/2017    Patient Active Problem List   Diagnosis Date Noted  . Rosacea 03/29/2017  . Acquired hypothyroidism 03/29/2017  . Pure hypercholesterolemia 03/29/2017  . Essential hypertension 03/29/2017  . Atopic dermatitis 03/29/2017  . Seasonal allergic rhinitis due to pollen 03/29/2017  . Nicotine dependence 12/27/2012    History reviewed. No pertinent surgical history.     Home Medications    Prior to Admission medications   Medication Sig Start Date End Date Taking? Authorizing Provider  amoxicillin (AMOXIL) 875 MG tablet Take 1 tablet (875 mg total) by mouth 2 (two) times daily. 12/31/18   Jarold MottoWorley, Samantha, PA  ketoconazole (NIZORAL) 2 % cream APP TO FACE D 01/24/18   [provider]  Omega-3 Fatty Acids (FISH OIL) 1000 MG CAPS  01/10/18   [provider]  thyroid (ARMOUR THYROID) 240 MG tablet TAKE 1/2 TABLET BY MOUTH DAILY IN THE MORNING 07/19/18   Helane RimaWallace, Erica,  DO    Family History Family History  Problem Relation Age of Onset  . Pancreatitis Mother   . Hypertension Father   . Colon cancer Maternal Grandfather     Social History Social History   Tobacco Use  . Smoking status: Former Smoker    Quit date: 07/02/2008    Years since quitting: 10.5  . Smokeless tobacco: Never Used  Substance Use Topics  . Alcohol use: Yes    Alcohol/week: 0.0 standard drinks    Comment: Stopped drinking 5 weeks ago  . Drug use: No     Allergies   Patient has no known allergies.   Review of Systems Review of Systems  Constitutional: Positive for fatigue.  Eyes: Negative for pain and visual disturbance.  Respiratory: Negative for shortness of breath.   Cardiovascular: Negative for palpitations.  Gastrointestinal: Negative for abdominal pain.  Genitourinary: Negative for hematuria.  Musculoskeletal: Negative for arthralgias and back pain.  Skin: Negative for color change.  Neurological: Negative for seizures and syncope.  All other systems reviewed and are negative.    Physical Exam Triage Vital Signs ED Triage Vitals  Enc Vitals Group     BP 12/31/18 1025 131/78     Pulse Rate 12/31/18 1025 70     Resp 12/31/18 1025 17     Temp 12/31/18 1025 99.1 F (37.3 C)     Temp Source 12/31/18 1025 Oral     SpO2  12/31/18 1025 100 %     Weight --      Height --      Head Circumference --      Peak Flow --      Pain Score 12/31/18 1023 5     Pain Loc --      Pain Edu? --      Excl. in [place]? --    No data found.  Updated Vital Signs BP 131/78 (BP Location: Left Arm)   Pulse 70   Temp 99.1 F (37.3 C) (Oral)   Resp 17   SpO2 100%   Visual Acuity Right Eye Distance:   Left Eye Distance:   Bilateral Distance:    Right Eye Near:   Left Eye Near:    Bilateral Near:     Physical Exam Vitals signs and nursing note reviewed.  Constitutional:      General: He is not in acute distress.    Appearance: He is well-developed. He is not  ill-appearing.  HENT:     Head: Normocephalic and atraumatic.     Right Ear: Tympanic membrane normal.     Left Ear: Tympanic membrane normal.     Nose: Nose normal.     Mouth/Throat:     Mouth: Mucous membranes are moist.     Pharynx: Posterior oropharyngeal erythema present. No oropharyngeal exudate.  Eyes:     Conjunctiva/sclera: Conjunctivae normal.  Neck:     Musculoskeletal: Neck supple.  Cardiovascular:     Rate and Rhythm: Normal rate and regular rhythm.  Pulmonary:     Effort: Pulmonary effort is normal. No respiratory distress.     Breath sounds: Normal breath sounds.  Abdominal:     Palpations: Abdomen is soft.     Tenderness: There is no abdominal tenderness. There is no right CVA tenderness, left CVA tenderness, guarding or rebound.  Skin:    General: Skin is warm and dry.  Neurological:     Mental Status: He is alert.      UC Treatments / Results  Labs (all labs ordered are listed, but only abnormal results are displayed) Labs Reviewed  NOVEL CORONAVIRUS, NAA (HOSPITAL ORDER, SEND-OUT TO REF LAB)  CULTURE, GROUP A STREP [facility])  POCT RAPID STREP A    EKG   Radiology No results found.  Procedures Procedures (including critical care time)  Medications Ordered in UC Medications - No data to display  Initial Impression / Assessment and Plan / UC Course  I have reviewed the triage vital signs and the nursing notes.  Pertinent labs & imaging results that were available during my care of the patient were reviewed by me and considered in my medical decision making (see chart for details).   Sore throat, Suspected COVID.  Rapid strep negative; culture pending.  COVID test obtained here and pending.  Discussed with patient that he should self quarantine until his COVID test is back and is negative.  Instructed patient to return here or follow-up with his PCP if he develops shortness of breath, cough, vomiting, diarrhea, other concerning symptoms.    Final  Clinical Impressions(s) / UC Diagnoses   Final diagnoses:  Suspected Covid-19 Virus Infection  Sore throat     Discharge Instructions     Your COVID test results are pending.  You should self quarantine until your test results are back and are negative.    Your rapid strep test was negative; a culture is pending.  We will call you if this  comes back positive needing treatment.  Return here or follow-up with your PCP if you develop shortness of breath, cough, vomiting, diarrhea, or other concerning symptoms.        ED Prescriptions    None     Controlled Substance Prescriptions [place] Controlled Substance Registry consulted? Not Applicable   Mickie Bailate, Jemila Camille H, NP 12/31/18 [phone]

## 2018-12-31 NOTE — Discharge Instructions (Addendum)
Your COVID test results are pending.  You should self quarantine until your test results are back and are negative.    Your rapid strep test was negative; a culture is pending.  We will call you if this comes back positive needing treatment.  Return here or follow-up with your PCP if you develop shortness of breath, cough, vomiting, diarrhea, or other concerning symptoms.

## 2019-01-01 ENCOUNTER — Encounter: Payer: Self-pay | Admitting: Physician Assistant

## 2019-01-01 LAB — CULTURE, GROUP A STREP (THRC)

## 2019-01-02 ENCOUNTER — Encounter (HOSPITAL_COMMUNITY): Payer: Self-pay

## 2019-01-02 LAB — NOVEL CORONAVIRUS, NAA (HOSP ORDER, SEND-OUT TO REF LAB; TAT 18-24 HRS): SARS-CoV-2, NAA: NOT DETECTED

## 2019-02-12 ENCOUNTER — Ambulatory Visit (INDEPENDENT_AMBULATORY_CARE_PROVIDER_SITE_OTHER): Payer: BC Managed Care – PPO | Admitting: Family Medicine

## 2019-02-12 ENCOUNTER — Encounter: Payer: Self-pay | Admitting: Family Medicine

## 2019-02-12 ENCOUNTER — Other Ambulatory Visit: Payer: Self-pay

## 2019-02-12 VITALS — BP 110/78 | HR 101 | Temp 98.3°F | Ht 68.0 in | Wt 151.2 lb

## 2019-02-12 DIAGNOSIS — Z23 Encounter for immunization: Secondary | ICD-10-CM

## 2019-02-12 DIAGNOSIS — L209 Atopic dermatitis, unspecified: Secondary | ICD-10-CM | POA: Diagnosis not present

## 2019-02-12 DIAGNOSIS — Z0000 Encounter for general adult medical examination without abnormal findings: Secondary | ICD-10-CM | POA: Diagnosis not present

## 2019-02-12 DIAGNOSIS — E7800 Pure hypercholesterolemia, unspecified: Secondary | ICD-10-CM

## 2019-02-12 DIAGNOSIS — L719 Rosacea, unspecified: Secondary | ICD-10-CM | POA: Diagnosis not present

## 2019-02-12 DIAGNOSIS — J029 Acute pharyngitis, unspecified: Secondary | ICD-10-CM | POA: Diagnosis not present

## 2019-02-12 DIAGNOSIS — F17200 Nicotine dependence, unspecified, uncomplicated: Secondary | ICD-10-CM

## 2019-02-12 DIAGNOSIS — Z20828 Contact with and (suspected) exposure to other viral communicable diseases: Secondary | ICD-10-CM

## 2019-02-12 DIAGNOSIS — E039 Hypothyroidism, unspecified: Secondary | ICD-10-CM

## 2019-02-12 LAB — CBC WITH DIFFERENTIAL/PLATELET
Basophils Absolute: 0 10*3/uL (ref 0.0–0.1)
Basophils Relative: 0.4 % (ref 0.0–3.0)
Eosinophils Absolute: 0 10*3/uL (ref 0.0–0.7)
Eosinophils Relative: 0 % (ref 0.0–5.0)
HCT: 46.9 % (ref 39.0–52.0)
Hemoglobin: 15.6 g/dL (ref 13.0–17.0)
Lymphocytes Relative: 7.2 % — ABNORMAL LOW (ref 12.0–46.0)
Lymphs Abs: 0.8 10*3/uL (ref 0.7–4.0)
MCHC: 33.2 g/dL (ref 30.0–36.0)
MCV: 90.6 fl (ref 78.0–100.0)
Monocytes Absolute: 1 10*3/uL (ref 0.1–1.0)
Monocytes Relative: 9.1 % (ref 3.0–12.0)
Neutro Abs: 9.5 10*3/uL — ABNORMAL HIGH (ref 1.4–7.7)
Neutrophils Relative %: 83.3 % — ABNORMAL HIGH (ref 43.0–77.0)
Platelets: 246 10*3/uL (ref 150.0–400.0)
RBC: 5.18 Mil/uL (ref 4.22–5.81)
RDW: 13.2 % (ref 11.5–15.5)
WBC: 11.4 10*3/uL — ABNORMAL HIGH (ref 4.0–10.5)

## 2019-02-12 LAB — LIPID PANEL
Cholesterol: 149 mg/dL (ref 0–200)
HDL: 73.2 mg/dL (ref >39.00)
LDL Cholesterol: 61 mg/dL (ref 0–99)
NonHDL: 75.94
Total CHOL/HDL Ratio: 2
Triglycerides: 76 mg/dL (ref 0.0–149.0)
VLDL: 15.2 mg/dL (ref 0.0–40.0)

## 2019-02-12 LAB — COMPREHENSIVE METABOLIC PANEL
ALT: 20 U/L (ref 0–53)
AST: 17 U/L (ref 0–37)
Albumin: 4.4 g/dL (ref 3.5–5.2)
Alkaline Phosphatase: 44 U/L (ref 39–117)
BUN: 15 mg/dL (ref 6–23)
CO2: 29 mEq/L (ref 19–32)
Calcium: 9.2 mg/dL (ref 8.4–10.5)
Chloride: 103 mEq/L (ref 96–112)
Creatinine, Ser: 0.89 mg/dL (ref 0.40–1.50)
GFR: 95.66 mL/min (ref >60.00)
Glucose, Bld: 94 mg/dL (ref 70–99)
Potassium: 4.7 mEq/L (ref 3.5–5.1)
Sodium: 140 mEq/L (ref 135–145)
Total Bilirubin: 1.2 mg/dL (ref 0.2–1.2)
Total Protein: 6.8 g/dL (ref 6.0–8.3)

## 2019-02-12 LAB — T4, FREE: Free T4: 0.7 ng/dL (ref 0.60–1.60)

## 2019-02-12 LAB — POCT RAPID STREP A (OFFICE): Rapid Strep A Screen: NEGATIVE

## 2019-02-12 LAB — T3, FREE: T3, Free: 3.9 pg/mL (ref 2.3–4.2)

## 2019-02-12 LAB — TSH: TSH: 1.97 u[IU]/mL (ref 0.35–4.50)

## 2019-02-12 MED ORDER — KETOCONAZOLE 2 % EX CREA: TOPICAL_CREAM | CUTANEOUS | 1 refills | Status: DC

## 2019-02-12 NOTE — Progress Notes (Signed)
Subjective:    Gregory Jennings is a 38 y.o. male who presents today for his Complete Annual Exam.  He is doing well.  He did have an initial visit with endocrinology last year and has been on 120 mcg of Armour Thyroid daily.  Feels euthyroid.  Traveled to [place] a few weeks ago.  Diagnosed with strep throat when he came back via culture.  Finished amoxicillin.  Still feels some mild swelling and discomfort in the throat.  No COVID exposures that he is aware of.  No cough, fever.  Asks for refill of ketoconazole.  Chewing Nicorette more often because of work now.   Current Outpatient Medications:  .  acetaminophen (TYLENOL) 500 MG tablet, Take 500 mg by mouth every 6 (six) hours as needed., Disp: , Rfl:  .  ketoconazole (NIZORAL) 2 % cream, APP TO FACE D, Disp: 15 g, Rfl: 1 .  Omega-3 Fatty Acids (FISH OIL) 1000 MG CAPS, , Disp: , Rfl:  .  thyroid (ARMOUR THYROID) 240 MG tablet, TAKE 1/2 TABLET BY MOUTH DAILY IN THE MORNING, Disp: 30 tablet, Rfl: 6 .  amoxicillin (AMOXIL) 875 MG tablet, Take 1 tablet (875 mg total) by mouth 2 (two) times daily., Disp: 20 tablet, Rfl: 0  There are no preventive care reminders to display for this patient. PMHx, SurgHx, SocialHx, Medications, and Allergies were reviewed in the Visit Navigator and updated as appropriate.   Past Medical History:  Diagnosis Date  . Acquired hypothyroidism 03/29/2017  . Atopic dermatitis 03/29/2017  . Essential hypertension 03/29/2017  . Hypertension   . Nicotine dependence 12/27/2012  . Pure hypercholesterolemia 03/29/2017  . Rosacea 03/29/2017  . Seasonal allergic rhinitis due to pollen 03/29/2017   History reviewed. No pertinent surgical history.   Family History  Problem Relation Age of Onset  . Pancreatitis Mother   . Hypertension Father   . Colon cancer Maternal Grandfather     Social History   Tobacco Use  . Smoking status: Former Smoker    Quit date: 07/02/2008    Years since quitting: 10.6  .  Smokeless tobacco: Never Used  Substance Use Topics  . Alcohol use: Yes    Alcohol/week: 0.0 standard drinks    Comment: Stopped drinking 5 weeks ago  . Drug use: No   Review of Systems:   Pertinent items are noted in the HPI. Otherwise, ROS is negative.  Objective:   Vitals:   02/12/19 0936  BP: 110/78  Pulse: (!) 101  Temp: 98.3 F (36.8 C)  SpO2: 98%   Body mass index is 22.99 kg/m.  General Appearance:  Alert, cooperative, no distress, appears stated age  Head:  Normocephalic, without obvious abnormality, atraumatic  Eyes:  PERRL, conjunctiva/corneas clear, EOM's intact, fundi benign, both eyes       Ears:  Normal TM's and external ear canals, both ears  Nose: Nares normal, septum midline, mucosa normal, no drainage    or sinus tenderness  Throat: Lips, mucosa, and tongue normal; teeth and gums normal  Neck: Supple, symmetrical, trachea midline, no adenopathy; thyroid:  No enlargement/tenderness/nodules; no carotit bruit or JVD  Back:   Symmetric, no curvature, ROM normal, no CVA tenderness  Lungs:   Clear to auscultation bilaterally, respirations unlabored  Chest wall:  No tenderness or deformity  Heart:  Regular rate and rhythm, S1 and S2 normal, no murmur, rub   or gallop  Abdomen:   Soft, non-tender, bowel sounds active all four quadrants, no masses, no organomegaly  Extremities: Extremities normal, atraumatic, no cyanosis or edema  Prostate: Not done.   Skin: Skin color, texture, turgor normal, no rashes or lesions  Lymph nodes: Cervical, supraclavicular, and axillary nodes normal  Neurologic: CNII-XII grossly intact. Normal strength, sensation and reflexes throughout   Assessment/Plan:   Gregory Jennings was seen today for annual exam, headache and sore throat.  Diagnoses and all orders for this visit:  Routine physical examination  Need for immunization against influenza -     Flu Vaccine QUAD 36+ mos IM  Atopic dermatitis, unspecified type -     ketoconazole  (NIZORAL) 2 % cream; APP TO FACE D  Acquired hypothyroidism -     TSH -     T4, free -     T3, free  Rosacea  Nicotine dependence, uncomplicated, unspecified nicotine product type  Pure hypercholesterolemia -     Comprehensive metabolic panel -     Lipid panel  Sore throat -     CBC with Differential/Platelet -     POCT rapid strep A -     Culture, Group A Strep -     SAR CoV2 Serology (COVID 19)AB(IGG)IA  Exposure to SARS-associated coronavirus -     SAR CoV2 Serology (COVID 19)AB(IGG)IA   Patient Counseling: [x]   Nutrition: Stressed importance of moderation in sodium/caffeine intake, saturated fat and cholesterol, caloric balance, sufficient intake of fresh fruits, vegetables, and fiber.  [x]   Stressed the importance of regular exercise.   []   Substance Abuse: Discussed cessation/primary prevention of tobacco, alcohol, or other drug use; driving or other dangerous activities under the influence; availability of treatment for abuse.   [x]   Injury prevention: Discussed safety belts, safety helmets, smoke detector, smoking near bedding or upholstery.   []   Sexuality: Discussed sexually transmitted diseases, partner selection, use of condoms, avoidance of unintended pregnancy and contraceptive alternatives.   [x]   Dental health: Discussed importance of regular tooth brushing, flossing, and dental visits.  [x]   Health maintenance and immunizations reviewed. Please refer to Health maintenance section.    Gregory Deutscher, DO [place]

## 2019-02-13 ENCOUNTER — Encounter: Payer: Self-pay | Admitting: Family Medicine

## 2019-02-13 LAB — SAR COV2 SEROLOGY (COVID19)AB(IGG),IA: SARS CoV2 AB IGG: NEGATIVE

## 2019-02-14 LAB — CULTURE, GROUP A STREP
MICRO NUMBER:: 841036
SPECIMEN QUALITY:: ADEQUATE

## 2019-02-19 ENCOUNTER — Encounter: Payer: Self-pay | Admitting: Family Medicine

## 2019-02-24 ENCOUNTER — Encounter: Payer: Self-pay | Admitting: Family Medicine

## 2019-02-24 ENCOUNTER — Telehealth: Payer: Self-pay | Admitting: Family Medicine

## 2019-02-24 ENCOUNTER — Other Ambulatory Visit: Payer: Self-pay

## 2019-02-24 ENCOUNTER — Other Ambulatory Visit (INDEPENDENT_AMBULATORY_CARE_PROVIDER_SITE_OTHER): Payer: BC Managed Care – PPO

## 2019-02-24 DIAGNOSIS — R5383 Other fatigue: Secondary | ICD-10-CM | POA: Diagnosis not present

## 2019-02-24 LAB — POCT MONO (EPSTEIN BARR VIRUS): Mono, POC: NEGATIVE

## 2019-02-24 NOTE — Telephone Encounter (Signed)
Called put order in and made app with her.

## 2019-02-24 NOTE — Telephone Encounter (Signed)
Okay to order monospot to be drawn today.

## 2019-02-24 NOTE — Telephone Encounter (Signed)
Please advise 

## 2019-02-24 NOTE — Telephone Encounter (Signed)
Addressed in phone note appointment made for labs and pt aware.

## 2019-02-24 NOTE — Telephone Encounter (Signed)
Patient was advised by Dr. Juleen China to have Mono test completed as a follow up.  Patient is requesting if the mono test can be ordered. He would like to have completed today.  Please advise when the order has been placed. CB- [phone] or in [place]. Patient does have access to MyChart.

## 2019-02-24 NOTE — Telephone Encounter (Signed)
Patient is calling to ask if he can can

## 2019-08-01 ENCOUNTER — Telehealth: Payer: Self-pay

## 2019-08-01 NOTE — Telephone Encounter (Signed)
Okay with Gregory Jennings.

## 2019-08-01 NOTE — Telephone Encounter (Signed)
New message    The patient wants to do a TOC from Dr. Helane Rima is no longer with the practice.   The patient is asking to see Dr. Yetta Barre or Dr. Jonny Ruiz.

## 2019-08-07 NOTE — Telephone Encounter (Signed)
Addendum    Left message on home voice mail per Physician recommendation left the office phone number to call back to set up an appt .

## 2019-08-09 ENCOUNTER — Ambulatory Visit: Payer: BC Managed Care – PPO | Attending: Internal Medicine

## 2019-08-09 DIAGNOSIS — Z23 Encounter for immunization: Secondary | ICD-10-CM | POA: Insufficient documentation

## 2019-08-09 NOTE — Progress Notes (Signed)
   Covid-19 Vaccination Clinic  Name:  Gregory Jennings    MRN: [id number] DOB: September 23, 1980  08/09/2019  [name] was observed post Covid-19 immunization for 15 minutes without incidence. He was provided with Vaccine Information Sheet and instruction to access the V-Safe system.   [name] was instructed to call 911 with any severe reactions post vaccine: Marland Kitchen Difficulty breathing  . Swelling of your face and throat  . A fast heartbeat  . A bad rash all over your body  . Dizziness and weakness    Immunizations Administered    Name Date Dose VIS Date Route   Pfizer COVID-19 Vaccine 08/09/2019  9:09 AM 0.3 mL 05/23/2019 Intramuscular   Manufacturer: [employer], [employer]   Lot: [id number]   NDC: [address]-5

## 2019-08-10 ENCOUNTER — Ambulatory Visit: Payer: BC Managed Care – PPO

## 2019-08-26 ENCOUNTER — Ambulatory Visit (INDEPENDENT_AMBULATORY_CARE_PROVIDER_SITE_OTHER): Payer: BC Managed Care – PPO | Admitting: Internal Medicine

## 2019-08-26 ENCOUNTER — Other Ambulatory Visit: Payer: Self-pay

## 2019-08-26 ENCOUNTER — Encounter: Payer: Self-pay | Admitting: Internal Medicine

## 2019-08-26 VITALS — BP 124/80 | HR 65 | Temp 98.2°F | Resp 16 | Ht 68.0 in | Wt 152.0 lb

## 2019-08-26 DIAGNOSIS — R6882 Decreased libido: Secondary | ICD-10-CM | POA: Diagnosis not present

## 2019-08-26 DIAGNOSIS — E039 Hypothyroidism, unspecified: Secondary | ICD-10-CM

## 2019-08-26 LAB — CBC WITH DIFFERENTIAL/PLATELET
Basophils Absolute: 0 10*3/uL (ref 0.0–0.1)
Basophils Relative: 0.6 % (ref 0.0–3.0)
Eosinophils Absolute: 0 10*3/uL (ref 0.0–0.7)
Eosinophils Relative: 0.9 % (ref 0.0–5.0)
HCT: 43.8 % (ref 39.0–52.0)
Hemoglobin: 15.1 g/dL (ref 13.0–17.0)
Lymphocytes Relative: 32 % (ref 12.0–46.0)
Lymphs Abs: 1.6 10*3/uL (ref 0.7–4.0)
MCHC: 34.4 g/dL (ref 30.0–36.0)
MCV: 90 fl (ref 78.0–100.0)
Monocytes Absolute: 0.5 10*3/uL (ref 0.1–1.0)
Monocytes Relative: 10.1 % (ref 3.0–12.0)
Neutro Abs: 2.8 10*3/uL (ref 1.4–7.7)
Neutrophils Relative %: 56.4 % (ref 43.0–77.0)
Platelets: 255 10*3/uL (ref 150.0–400.0)
RBC: 4.87 Mil/uL (ref 4.22–5.81)
RDW: 13.2 % (ref 11.5–15.5)
WBC: 4.9 10*3/uL (ref 4.0–10.5)

## 2019-08-26 NOTE — Progress Notes (Signed)
Subjective:  Patient ID: Gregory Jennings, male    DOB: Sep 30, 1980  Age: 39 y.o. MRN: [id number]  CC: Hypothyroidism  This visit occurred during the SARS-CoV-2 public health emergency.  Safety protocols were in place, including screening questions prior to the visit, additional usage of staff PPE, and extensive cleaning of exam room while observing appropriate contact time as indicated for disinfecting solutions.    HPI Gregory Jennings presents for transfer of care.  He has a 5-year history of spontaneous hypothyroidism.  He is currently taking 120 mg of Armour Thyroid a day.  He complains that over the last few months he has developed constipation, fatigue, and slight weight gain.  He also complains of a chronically low libido.  He tells me his wife complains about it.  He denies erectile dysfunction.  History [place] has a past medical history of Acquired hypothyroidism (03/29/2017), Atopic dermatitis (03/29/2017), Essential hypertension (03/29/2017), Hypertension, Nicotine dependence (12/27/2012), Pure hypercholesterolemia (03/29/2017), Rosacea (03/29/2017), and Seasonal allergic rhinitis due to pollen (03/29/2017).   He has no past surgical history on file.   His family history includes Colon cancer in his maternal grandfather; Hypertension in his father; Pancreatitis in his mother.He reports that he quit smoking about 11 years ago. He has never used smokeless tobacco. He reports previous alcohol use. He reports current drug use. Drug: Marijuana.  Outpatient Medications Prior to Visit  Medication Sig Dispense Refill  . Omega-3 Fatty Acids (FISH OIL) 1000 MG CAPS     . thyroid (ARMOUR THYROID) 240 MG tablet TAKE 1/2 TABLET BY MOUTH DAILY IN THE MORNING 30 tablet 6  . acetaminophen (TYLENOL) 500 MG tablet Take 500 mg by mouth every 6 (six) hours as needed.    Marland Kitchen amoxicillin (AMOXIL) 875 MG tablet Take 1 tablet (875 mg total) by mouth 2 (two) times daily. 20 tablet 0  . ketoconazole (NIZORAL)  2 % cream APP TO FACE D 15 g 1   No facility-administered medications prior to visit.    ROS Review of Systems  Constitutional: Positive for fatigue and unexpected weight change. Negative for diaphoresis.  HENT: Negative.   Eyes: Negative.   Respiratory: Negative for cough, chest tightness, shortness of breath and wheezing.   Cardiovascular: Negative for chest pain, palpitations and leg swelling.  Gastrointestinal: Positive for constipation. Negative for abdominal pain, diarrhea, nausea and vomiting.  Endocrine: Negative for cold intolerance and heat intolerance.  Genitourinary: Negative.  Negative for difficulty urinating.  Musculoskeletal: Negative.   Skin: Negative.   Neurological: Negative.  Negative for dizziness and weakness.  Hematological: Negative for adenopathy. Does not bruise/bleed easily.  Psychiatric/Behavioral: Negative.     Objective:  BP 124/80 (BP Location: Left Arm, Patient Position: Sitting, Cuff Size: Large)   Pulse 65   Temp 98.2 F (36.8 C) (Oral)   Resp 16   Ht 5\' 8"  (1.727 m)   Wt 152 lb (68.9 kg)   SpO2 99%   BMI 23.11 kg/m   Physical Exam Vitals reviewed.  Constitutional:      Appearance: Normal appearance.  HENT:     Nose: Nose normal.     Mouth/Throat:     Mouth: Mucous membranes are moist.  Eyes:     General: No scleral icterus. Cardiovascular:     Rate and Rhythm: Normal rate and regular rhythm.     Heart sounds: No murmur.  Pulmonary:     Effort: Pulmonary effort is normal.     Breath sounds: No stridor. No wheezing, rhonchi or rales.  Abdominal:     General: Abdomen is flat. Bowel sounds are normal. There is no distension.     Palpations: Abdomen is soft. There is no hepatomegaly, splenomegaly or mass.     Tenderness: There is no abdominal tenderness.  Musculoskeletal:        General: Normal range of motion.     Cervical back: Normal range of motion.     Right lower leg: No edema.     Left lower leg: No edema.   Lymphadenopathy:     Cervical: No cervical adenopathy.  Skin:    General: Skin is warm and dry.     Coloration: Skin is not pale.  Neurological:     General: No focal deficit present.     Mental Status: He is alert.     Lab Results  Component Value Date   WBC 4.9 08/26/2019   HGB 15.1 08/26/2019   HCT 43.8 08/26/2019   PLT 255.0 08/26/2019   GLUCOSE 94 02/12/2019   CHOL 149 02/12/2019   TRIG 76.0 02/12/2019   HDL 73.20 02/12/2019   LDLCALC 61 02/12/2019   ALT 20 02/12/2019   AST 17 02/12/2019   NA 140 02/12/2019   K 4.7 02/12/2019   CL 103 02/12/2019   CREATININE 0.89 02/12/2019   BUN 15 02/12/2019   CO2 29 02/12/2019   TSH 5.04 (H) 08/26/2019    Assessment & Plan:   Taelon was seen today for hypothyroidism.  Diagnoses and all orders for this visit:  Acquired hypothyroidism- His TSH is mildly elevated and he is symptomatic.  I have asked him to increase the dose of his Armour Thyroid. -     CBC with Differential/Platelet -     Thyroid Panel With TSH -     thyroid (ARMOUR THYROID) 180 MG tablet; Take 1 tablet (180 mg total) by mouth daily.  Low libido- He has a normal testosterone level but a low bioavailable testosterone level due to an elevated sex hormone binding globulin.  I have asked him to see endocrinology to have this evaluated. -     Testosterone Total,Free,Bio, Males -     Ambulatory referral to Endocrinology   I have discontinued Hurschel Buttacavoli's thyroid, amoxicillin, acetaminophen, and ketoconazole. I am also having him start on thyroid. Additionally, I am having him maintain his Fish Oil.  Meds ordered this encounter  Medications  . thyroid (ARMOUR THYROID) 180 MG tablet    Sig: Take 1 tablet (180 mg total) by mouth daily.    Dispense:  90 tablet    Refill:  0     Follow-up: Return in about 6 months (around 02/26/2020).  Sanda Linger, MD

## 2019-08-26 NOTE — Patient Instructions (Signed)
Hypothyroidism  Hypothyroidism is when the thyroid gland does not make enough of certain hormones (it is underactive). The thyroid gland is a small gland located in the lower front part of the neck, just in front of the windpipe (trachea). This gland makes hormones that help control how the body uses food for energy (metabolism) as well as how the heart and brain function. These hormones also play a role in keeping your bones strong. When the thyroid is underactive, it produces too little of the hormones thyroxine (T4) and triiodothyronine (T3). What are the causes? This condition may be caused by:  Hashimoto's disease. This is a disease in which the body's disease-fighting system (immune system) attacks the thyroid gland. This is the most common cause.  Viral infections.  Pregnancy.  Certain medicines.  Birth defects.  Past radiation treatments to the head or neck for cancer.  Past treatment with radioactive iodine.  Past exposure to radiation in the environment.  Past surgical removal of part or all of the thyroid.  Problems with a gland in the center of the brain (pituitary gland).  Lack of enough iodine in the diet. What increases the risk? You are more likely to develop this condition if:  You are male.  You have a family history of thyroid conditions.  You use a medicine called lithium.  You take medicines that affect the immune system (immunosuppressants). What are the signs or symptoms? Symptoms of this condition include:  Feeling as though you have no energy (lethargy).  Not being able to tolerate cold.  Weight gain that is not explained by a change in diet or exercise habits.  Lack of appetite.  Dry skin.  Coarse hair.  Menstrual irregularity.  Slowing of thought processes.  Constipation.  Sadness or depression. How is this diagnosed? This condition may be diagnosed based on:  Your symptoms, your medical history, and a physical exam.  Blood  tests. You may also have imaging tests, such as an ultrasound or MRI. How is this treated? This condition is treated with medicine that replaces the thyroid hormones that your body does not make. After you begin treatment, it may take several weeks for symptoms to go away. Follow these instructions at home:  Take over-the-counter and prescription medicines only as told by your health care provider.  If you start taking any new medicines, tell your health care provider.  Keep all follow-up visits as told by your health care provider. This is important. ? As your condition improves, your dosage of thyroid hormone medicine may change. ? You will need to have blood tests regularly so that your health care provider can monitor your condition. Contact a health care provider if:  Your symptoms do not get better with treatment.  You are taking thyroid replacement medicine and you: ? Sweat a lot. ? Have tremors. ? Feel anxious. ? Lose weight rapidly. ? Cannot tolerate heat. ? Have emotional swings. ? Have diarrhea. ? Feel weak. Get help right away if you have:  Chest pain.  An irregular heartbeat.  A rapid heartbeat.  Difficulty breathing. Summary  Hypothyroidism is when the thyroid gland does not make enough of certain hormones (it is underactive).  When the thyroid is underactive, it produces too little of the hormones thyroxine (T4) and triiodothyronine (T3).  The most common cause is Hashimoto's disease, a disease in which the body's disease-fighting system (immune system) attacks the thyroid gland. The condition can also be caused by viral infections, medicine, pregnancy, or past   radiation treatment to the head or neck.  Symptoms may include weight gain, dry skin, constipation, feeling as though you do not have energy, and not being able to tolerate cold.  This condition is treated with medicine to replace the thyroid hormones that your body does not make. This information  is not intended to replace advice given to you by your health care provider. Make sure you discuss any questions you have with your health care provider. Document Revised: 05/11/2017 Document Reviewed: 05/09/2017 Elsevier Patient Education  2020 Elsevier Inc.  

## 2019-08-27 ENCOUNTER — Encounter: Payer: Self-pay | Admitting: Internal Medicine

## 2019-08-27 LAB — TESTOSTERONE TOTAL,FREE,BIO, MALES
Albumin: 4.4 g/dL (ref 3.6–5.1)
Sex Hormone Binding: 68 nmol/L — ABNORMAL HIGH (ref 10–50)
Testosterone, Bioavailable: 84.4 ng/dL — ABNORMAL LOW (ref >=110.0)
Testosterone, Free: 41.9 pg/mL — ABNORMAL LOW (ref 46.0–224.0)
Testosterone: 582 ng/dL (ref 250–827)

## 2019-08-27 LAB — THYROID PANEL WITH TSH
Free Thyroxine Index: 1.6 (ref 1.4–3.8)
T3 Uptake: 31 % (ref 22–35)
T4, Total: 5.3 ug/dL (ref 4.9–10.5)
TSH: 5.04 mIU/L — ABNORMAL HIGH (ref 0.40–4.50)

## 2019-08-27 MED ORDER — THYROID 180 MG PO TABS: 180.0000 mg | ORAL_TABLET | Freq: Every day | ORAL | 0 refills | Status: DC

## 2019-08-28 ENCOUNTER — Encounter: Payer: Self-pay | Admitting: Internal Medicine

## 2019-08-30 ENCOUNTER — Ambulatory Visit: Payer: BC Managed Care – PPO | Attending: Internal Medicine

## 2019-08-30 DIAGNOSIS — Z23 Encounter for immunization: Secondary | ICD-10-CM

## 2019-08-30 NOTE — Progress Notes (Signed)
   Covid-19 Vaccination Clinic  Name:  Gregory Jennings    MRN: [id number] DOB: 1980-10-17  08/30/2019  [name] was observed post Covid-19 immunization for 15 minutes without incident. He was provided with Vaccine Information Sheet and instruction to access the V-Safe system.   [name] was instructed to call 911 with any severe reactions post vaccine: Marland Kitchen Difficulty breathing  . Swelling of face and throat  . A fast heartbeat  . A bad rash all over body  . Dizziness and weakness   Immunizations Administered    Name Date Dose VIS Date Route   Pfizer COVID-19 Vaccine 08/30/2019 11:31 AM 0.3 mL 05/23/2019 Intramuscular   Manufacturer: [employer], [employer]   Lot: [id number]   NDC: [address]-5

## 2019-09-03 ENCOUNTER — Ambulatory Visit: Payer: BC Managed Care – PPO

## 2019-09-13 ENCOUNTER — Other Ambulatory Visit: Payer: Self-pay | Admitting: Family Medicine

## 2019-11-18 ENCOUNTER — Encounter: Payer: Self-pay | Admitting: Internal Medicine

## 2019-11-18 ENCOUNTER — Other Ambulatory Visit: Payer: Self-pay | Admitting: Internal Medicine

## 2019-11-18 DIAGNOSIS — E039 Hypothyroidism, unspecified: Secondary | ICD-10-CM

## 2019-11-20 ENCOUNTER — Other Ambulatory Visit: Payer: Self-pay | Admitting: Internal Medicine

## 2019-11-20 ENCOUNTER — Other Ambulatory Visit (INDEPENDENT_AMBULATORY_CARE_PROVIDER_SITE_OTHER): Payer: BC Managed Care – PPO

## 2019-11-20 ENCOUNTER — Other Ambulatory Visit: Payer: Self-pay

## 2019-11-20 DIAGNOSIS — E039 Hypothyroidism, unspecified: Secondary | ICD-10-CM | POA: Diagnosis not present

## 2019-11-20 LAB — TSH: TSH: 0.07 u[IU]/mL — ABNORMAL LOW (ref 0.35–4.50)

## 2019-11-20 MED ORDER — THYROID 120 MG PO TABS: 120.0000 mg | ORAL_TABLET | Freq: Every day | ORAL | 0 refills | Status: DC

## 2019-11-25 ENCOUNTER — Other Ambulatory Visit: Payer: Self-pay | Admitting: Internal Medicine

## 2019-11-25 DIAGNOSIS — E039 Hypothyroidism, unspecified: Secondary | ICD-10-CM

## 2019-11-25 MED ORDER — THYROID 240 MG PO TABS: 120.0000 mg | ORAL_TABLET | Freq: Every day | ORAL | 0 refills | Status: DC

## 2020-02-25 ENCOUNTER — Encounter: Payer: Self-pay | Admitting: Internal Medicine

## 2020-02-25 ENCOUNTER — Other Ambulatory Visit: Payer: Self-pay | Admitting: Internal Medicine

## 2020-02-25 DIAGNOSIS — E039 Hypothyroidism, unspecified: Secondary | ICD-10-CM

## 2020-02-25 MED ORDER — THYROID 240 MG PO TABS: 120.0000 mg | ORAL_TABLET | Freq: Every day | ORAL | 0 refills | Status: DC

## 2020-03-11 ENCOUNTER — Ambulatory Visit (INDEPENDENT_AMBULATORY_CARE_PROVIDER_SITE_OTHER): Payer: BC Managed Care – PPO | Admitting: Internal Medicine

## 2020-03-11 ENCOUNTER — Encounter: Payer: Self-pay | Admitting: Internal Medicine

## 2020-03-11 ENCOUNTER — Other Ambulatory Visit: Payer: Self-pay

## 2020-03-11 VITALS — BP 124/70 | HR 74 | Temp 98.5°F | Resp 16 | Ht 68.0 in | Wt 145.8 lb

## 2020-03-11 DIAGNOSIS — R6882 Decreased libido: Secondary | ICD-10-CM

## 2020-03-11 DIAGNOSIS — E039 Hypothyroidism, unspecified: Secondary | ICD-10-CM | POA: Diagnosis not present

## 2020-03-11 NOTE — Patient Instructions (Signed)
Hypothyroidism  Hypothyroidism is when the thyroid gland does not make enough of certain hormones (it is underactive). The thyroid gland is a small gland located in the lower front part of the neck, just in front of the windpipe (trachea). This gland makes hormones that help control how the body uses food for energy (metabolism) as well as how the heart and brain function. These hormones also play a role in keeping your bones strong. When the thyroid is underactive, it produces too little of the hormones thyroxine (T4) and triiodothyronine (T3). What are the causes? This condition may be caused by:  Hashimoto's disease. This is a disease in which the body's disease-fighting system (immune system) attacks the thyroid gland. This is the most common cause.  Viral infections.  Pregnancy.  Certain medicines.  Birth defects.  Past radiation treatments to the head or neck for cancer.  Past treatment with radioactive iodine.  Past exposure to radiation in the environment.  Past surgical removal of part or all of the thyroid.  Problems with a gland in the center of the brain (pituitary gland).  Lack of enough iodine in the diet. What increases the risk? You are more likely to develop this condition if:  You are male.  You have a family history of thyroid conditions.  You use a medicine called lithium.  You take medicines that affect the immune system (immunosuppressants). What are the signs or symptoms? Symptoms of this condition include:  Feeling as though you have no energy (lethargy).  Not being able to tolerate cold.  Weight gain that is not explained by a change in diet or exercise habits.  Lack of appetite.  Dry skin.  Coarse hair.  Menstrual irregularity.  Slowing of thought processes.  Constipation.  Sadness or depression. How is this diagnosed? This condition may be diagnosed based on:  Your symptoms, your medical history, and a physical exam.  Blood  tests. You may also have imaging tests, such as an ultrasound or MRI. How is this treated? This condition is treated with medicine that replaces the thyroid hormones that your body does not make. After you begin treatment, it may take several weeks for symptoms to go away. Follow these instructions at home:  Take over-the-counter and prescription medicines only as told by your health care provider.  If you start taking any new medicines, tell your health care provider.  Keep all follow-up visits as told by your health care provider. This is important. ? As your condition improves, your dosage of thyroid hormone medicine may change. ? You will need to have blood tests regularly so that your health care provider can monitor your condition. Contact a health care provider if:  Your symptoms do not get better with treatment.  You are taking thyroid replacement medicine and you: ? Sweat a lot. ? Have tremors. ? Feel anxious. ? Lose weight rapidly. ? Cannot tolerate heat. ? Have emotional swings. ? Have diarrhea. ? Feel weak. Get help right away if you have:  Chest pain.  An irregular heartbeat.  A rapid heartbeat.  Difficulty breathing. Summary  Hypothyroidism is when the thyroid gland does not make enough of certain hormones (it is underactive).  When the thyroid is underactive, it produces too little of the hormones thyroxine (T4) and triiodothyronine (T3).  The most common cause is Hashimoto's disease, a disease in which the body's disease-fighting system (immune system) attacks the thyroid gland. The condition can also be caused by viral infections, medicine, pregnancy, or past   radiation treatment to the head or neck.  Symptoms may include weight gain, dry skin, constipation, feeling as though you do not have energy, and not being able to tolerate cold.  This condition is treated with medicine to replace the thyroid hormones that your body does not make. This information  is not intended to replace advice given to you by your health care provider. Make sure you discuss any questions you have with your health care provider. Document Revised: 05/11/2017 Document Reviewed: 05/09/2017 Elsevier Patient Education  2020 Elsevier Inc.  

## 2020-03-11 NOTE — Progress Notes (Signed)
Subjective:  Patient ID: Gregory Jennings, male    DOB: 1980/06/13  Age: 39 y.o. MRN: [id number]  CC: Hypothyroidism  This visit occurred during the SARS-CoV-2 public health emergency.  Safety protocols were in place, including screening questions prior to the visit, additional usage of staff PPE, and extensive cleaning of exam room while observing appropriate contact time as indicated for disinfecting solutions.    HPI Gregory Jennings presents for f/up - He complains of cold intolerance, low libido, increased desire for sleep, and mild constipation.  Outpatient Medications Prior to Visit  Medication Sig Dispense Refill  . thyroid (ARMOUR THYROID) 240 MG tablet Take 0.5 tablets (120 mg total) by mouth daily. 15 tablet 0  . Omega-3 Fatty Acids (FISH OIL) 1000 MG CAPS      No facility-administered medications prior to visit.    ROS Review of Systems  Constitutional: Negative for diaphoresis, fatigue and unexpected weight change.  HENT: Negative for sore throat and trouble swallowing.   Eyes: Negative.   Respiratory: Negative for cough, chest tightness, shortness of breath and wheezing.   Cardiovascular: Negative for chest pain, palpitations and leg swelling.  Gastrointestinal: Positive for constipation. Negative for abdominal pain, diarrhea and nausea.  Endocrine: Positive for cold intolerance. Negative for heat intolerance.  Musculoskeletal: Negative.  Negative for arthralgias and myalgias.  Skin: Negative for color change.  Neurological: Negative.  Negative for dizziness.  Hematological: Negative for adenopathy. Does not bruise/bleed easily.  Psychiatric/Behavioral: Negative.     Objective:  BP 124/70 (BP Location: Left Arm, Patient Position: Sitting, Cuff Size: Normal)   Pulse 74   Temp 98.5 F (36.9 C) (Oral)   Resp 16   Ht 5\' 8"  (1.727 m)   Wt 145 lb 12.8 oz (66.1 kg)   SpO2 98%   BMI 22.17 kg/m   BP Readings from Last 3 Encounters:  03/11/20 124/70  08/26/19  124/80  02/12/19 110/78    Wt Readings from Last 3 Encounters:  03/11/20 145 lb 12.8 oz (66.1 kg)  08/26/19 152 lb (68.9 kg)  02/12/19 151 lb 3.2 oz (68.6 kg)    Physical Exam Vitals reviewed.  Constitutional:      Appearance: Normal appearance.  HENT:     Nose: Nose normal.     Mouth/Throat:     Mouth: Mucous membranes are moist.  Eyes:     General: No scleral icterus.    Conjunctiva/sclera: Conjunctivae normal.  Cardiovascular:     Rate and Rhythm: Normal rate and regular rhythm.     Heart sounds: No murmur heard.   Pulmonary:     Effort: Pulmonary effort is normal.     Breath sounds: No stridor. No wheezing, rhonchi or rales.  Abdominal:     General: Abdomen is flat. There is no distension.     Palpations: There is no hepatomegaly, splenomegaly or mass.     Tenderness: There is no abdominal tenderness.  Musculoskeletal:        General: Normal range of motion.     Cervical back: Neck supple.     Right lower leg: No edema.     Left lower leg: No edema.  Lymphadenopathy:     Cervical: No cervical adenopathy.  Skin:    General: Skin is warm and dry.     Coloration: Skin is not pale.  Neurological:     General: No focal deficit present.     Mental Status: He is alert.  Psychiatric:        Mood  and Affect: Mood normal.        Behavior: Behavior normal.     Lab Results  Component Value Date   WBC 4.9 08/26/2019   HGB 15.1 08/26/2019   HCT 43.8 08/26/2019   PLT 255.0 08/26/2019   GLUCOSE 94 02/12/2019   CHOL 149 02/12/2019   TRIG 76.0 02/12/2019   HDL 73.20 02/12/2019   LDLCALC 61 02/12/2019   ALT 20 02/12/2019   AST 17 02/12/2019   NA 140 02/12/2019   K 4.7 02/12/2019   CL 103 02/12/2019   CREATININE 0.89 02/12/2019   BUN 15 02/12/2019   CO2 29 02/12/2019   TSH 4.36 03/11/2020    No results found.  Assessment & Plan:   Gregory Jennings was seen today for hypothyroidism.  Diagnoses and all orders for this visit:  Acquired hypothyroidism- His TSH is  elevated at 4.36.  I recommended that he transition to a more reliable source of thyroid replacement therapy.  He will take levothyroxine 200 mcg a day which is the equivalent to his current Armour Thyroid dose and we will recheck his TSH in about 2 months. -     TSH; Future -     TSH -     levothyroxine (SYNTHROID) 200 MCG tablet; Take 1 tablet (200 mcg total) by mouth daily before breakfast.  Low libido- His testosterone and prolactin are normal.  There does not appear to be any secondary cause for the low libido o/t the hypothyroidism. -     Testosterone Total,Free,Bio, Males; Future -     Prolactin; Future -     Testosterone Total,Free,Bio, Males -     Prolactin   I have discontinued Gregory Jennings's thyroid. I am also having him start on levothyroxine. Additionally, I am having him maintain his Fish Oil.  Meds ordered this encounter  Medications  . levothyroxine (SYNTHROID) 200 MCG tablet    Sig: Take 1 tablet (200 mcg total) by mouth daily before breakfast.    Dispense:  90 tablet    Refill:  0     Follow-up: Return in about 6 months (around 09/08/2020).  Sanda Linger, MD

## 2020-03-12 LAB — TESTOSTERONE TOTAL,FREE,BIO, MALES
Albumin: 4.8 g/dL (ref 3.6–5.1)
Sex Hormone Binding: 51 nmol/L — ABNORMAL HIGH (ref 10–50)
Testosterone, Bioavailable: 110.3 ng/dL (ref >=110.0)
Testosterone, Free: 50.4 pg/mL (ref 46.0–224.0)
Testosterone: 548 ng/dL (ref 250–827)

## 2020-03-12 LAB — TSH: TSH: 4.36 mIU/L (ref 0.40–4.50)

## 2020-03-12 LAB — PROLACTIN: Prolactin: 7.5 ng/mL (ref 2.0–18.0)

## 2020-03-14 ENCOUNTER — Encounter: Payer: Self-pay | Admitting: Internal Medicine

## 2020-03-14 MED ORDER — LEVOTHYROXINE SODIUM 200 MCG PO TABS: 200.0000 ug | ORAL_TABLET | Freq: Every day | ORAL | 0 refills | Status: DC

## 2020-05-17 LAB — TSH: TSH: 0.87 (ref 0.41–5.90)

## 2020-05-17 LAB — HM HIV SCREENING LAB: HM HIV Screening: NEGATIVE

## 2020-06-10 ENCOUNTER — Other Ambulatory Visit: Payer: Self-pay | Admitting: Internal Medicine

## 2020-06-10 DIAGNOSIS — E039 Hypothyroidism, unspecified: Secondary | ICD-10-CM

## 2020-09-08 ENCOUNTER — Other Ambulatory Visit: Payer: Self-pay | Admitting: Internal Medicine

## 2020-09-08 DIAGNOSIS — E039 Hypothyroidism, unspecified: Secondary | ICD-10-CM

## 2020-12-02 ENCOUNTER — Other Ambulatory Visit: Payer: Self-pay | Admitting: Internal Medicine

## 2020-12-02 DIAGNOSIS — E039 Hypothyroidism, unspecified: Secondary | ICD-10-CM

## 2020-12-17 ENCOUNTER — Telehealth: Payer: Self-pay | Admitting: Internal Medicine

## 2020-12-17 DIAGNOSIS — E039 Hypothyroidism, unspecified: Secondary | ICD-10-CM

## 2020-12-17 NOTE — Telephone Encounter (Signed)
   Patient is requesting a short supply of medication until he can be seen on 8/15. It can be sent to [facility] [address] [place], [place] [address].  He was also wondering if he needed to do lab work before the appointment. Patient is requesting a call. Please advise

## 2020-12-20 ENCOUNTER — Other Ambulatory Visit: Payer: Self-pay | Admitting: Internal Medicine

## 2020-12-20 DIAGNOSIS — E039 Hypothyroidism, unspecified: Secondary | ICD-10-CM

## 2020-12-20 MED ORDER — LEVOTHYROXINE SODIUM 200 MCG PO TABS: ORAL_TABLET | ORAL | 0 refills | Status: DC

## 2021-01-03 ENCOUNTER — Encounter: Payer: Self-pay | Admitting: Internal Medicine

## 2021-01-03 NOTE — Telephone Encounter (Signed)
Called pt, LVM to move OV appointment up.

## 2021-01-04 ENCOUNTER — Other Ambulatory Visit: Payer: Self-pay

## 2021-01-04 ENCOUNTER — Ambulatory Visit: Payer: BC Managed Care – PPO | Admitting: Internal Medicine

## 2021-01-04 ENCOUNTER — Encounter: Payer: Self-pay | Admitting: Internal Medicine

## 2021-01-04 VITALS — BP 114/76 | HR 64 | Temp 98.5°F | Ht 68.0 in | Wt 145.0 lb

## 2021-01-04 DIAGNOSIS — F341 Dysthymic disorder: Secondary | ICD-10-CM | POA: Diagnosis not present

## 2021-01-04 DIAGNOSIS — E039 Hypothyroidism, unspecified: Secondary | ICD-10-CM

## 2021-01-04 DIAGNOSIS — I10 Essential (primary) hypertension: Secondary | ICD-10-CM

## 2021-01-04 LAB — CBC WITH DIFFERENTIAL/PLATELET
Basophils Absolute: 0 10*3/uL (ref 0.0–0.1)
Basophils Relative: 0.7 % (ref 0.0–3.0)
Eosinophils Absolute: 0 10*3/uL (ref 0.0–0.7)
Eosinophils Relative: 0.5 % (ref 0.0–5.0)
HCT: 48 % (ref 39.0–52.0)
Hemoglobin: 16.2 g/dL (ref 13.0–17.0)
Lymphocytes Relative: 33.7 % (ref 12.0–46.0)
Lymphs Abs: 1.9 10*3/uL (ref 0.7–4.0)
MCHC: 33.9 g/dL (ref 30.0–36.0)
MCV: 89.3 fl (ref 78.0–100.0)
Monocytes Absolute: 0.6 10*3/uL (ref 0.1–1.0)
Monocytes Relative: 9.7 % (ref 3.0–12.0)
Neutro Abs: 3.1 10*3/uL (ref 1.4–7.7)
Neutrophils Relative %: 55.4 % (ref 43.0–77.0)
Platelets: 264 10*3/uL (ref 150.0–400.0)
RBC: 5.37 Mil/uL (ref 4.22–5.81)
RDW: 13.4 % (ref 11.5–15.5)
WBC: 5.7 10*3/uL (ref 4.0–10.5)

## 2021-01-04 LAB — BASIC METABOLIC PANEL
BUN: 15 mg/dL (ref 6–23)
CO2: 28 mEq/L (ref 19–32)
Calcium: 9.5 mg/dL (ref 8.4–10.5)
Chloride: 104 mEq/L (ref 96–112)
Creatinine, Ser: 0.99 mg/dL (ref 0.40–1.50)
GFR: 95.7 mL/min (ref >60.00)
Glucose, Bld: 88 mg/dL (ref 70–99)
Potassium: 3.9 mEq/L (ref 3.5–5.1)
Sodium: 140 mEq/L (ref 135–145)

## 2021-01-04 LAB — HEPATIC FUNCTION PANEL
ALT: 21 U/L (ref 0–53)
AST: 18 U/L (ref 0–37)
Albumin: 4.6 g/dL (ref 3.5–5.2)
Alkaline Phosphatase: 46 U/L (ref 39–117)
Bilirubin, Direct: 0.2 mg/dL (ref 0.0–0.3)
Total Bilirubin: 0.9 mg/dL (ref 0.2–1.2)
Total Protein: 7.2 g/dL (ref 6.0–8.3)

## 2021-01-04 LAB — TSH: TSH: 0.42 u[IU]/mL (ref 0.35–5.50)

## 2021-01-04 NOTE — Progress Notes (Signed)
Subjective:  Patient ID: Gregory Jennings, male    DOB: 06/28/1980  Age: 40 y.o. MRN: [id number]  CC: Hypothyroidism  This visit occurred during the SARS-CoV-2 public health emergency.  Safety protocols were in place, including screening questions prior to the visit, additional usage of staff PPE, and extensive cleaning of exam room while observing appropriate contact time as indicated for disinfecting solutions.    HPI Gregory Jennings presents for f/up -  He complains of a 1 week history of apathy and anhedonia.  He tells me he uses cannabis to help boost his mood and for a calming effect.  He denies SI, HI, sleep disturbance, or changes in his weight or appetite.  He recently ran out of T4 and instead has been taking [employer] Thyroid.  He complains of constipation and cold intolerance.  Outpatient Medications Prior to Visit  Medication Sig Dispense Refill   levothyroxine (SYNTHROID) 200 MCG tablet TAKE 1 TABLET(200 MCG) BY MOUTH DAILY BEFORE AND BREAKFAST 30 tablet 0   No facility-administered medications prior to visit.    ROS Review of Systems  Constitutional:  Negative for diaphoresis, fatigue and unexpected weight change.  HENT: Negative.  Negative for sore throat and trouble swallowing.   Eyes:  Negative for visual disturbance.  Respiratory:  Negative for chest tightness, shortness of breath and wheezing.   Cardiovascular:  Negative for chest pain, palpitations and leg swelling.  Gastrointestinal:  Positive for constipation. Negative for diarrhea, nausea and vomiting.  Endocrine: Positive for cold intolerance. Negative for heat intolerance.  Genitourinary: Negative.  Negative for difficulty urinating.  Musculoskeletal: Negative.   Skin: Negative.  Negative for color change.  Hematological:  Negative for adenopathy. Does not bruise/bleed easily.  Psychiatric/Behavioral:  Positive for dysphoric mood. Negative for sleep disturbance and suicidal ideas. The patient is not  nervous/anxious.    Objective:  BP 114/76 (BP Location: Right Arm, Patient Position: Sitting, Cuff Size: Large)   Pulse 64   Temp 98.5 F (36.9 C) (Oral)   Ht 5\' 8"  (1.727 m)   Wt 145 lb (65.8 kg)   SpO2 98%   BMI 22.05 kg/m   BP Readings from Last 3 Encounters:  01/04/21 114/76  03/11/20 124/70  08/26/19 124/80    Wt Readings from Last 3 Encounters:  01/04/21 145 lb (65.8 kg)  03/11/20 145 lb 12.8 oz (66.1 kg)  08/26/19 152 lb (68.9 kg)    Physical Exam Vitals reviewed.  HENT:     Nose: Nose normal.     Mouth/Throat:     Mouth: Mucous membranes are moist.  Eyes:     Conjunctiva/sclera: Conjunctivae normal.  Cardiovascular:     Rate and Rhythm: Normal rate and regular rhythm.     Heart sounds: No murmur heard. Pulmonary:     Effort: Pulmonary effort is normal.     Breath sounds: No stridor. No wheezing, rhonchi or rales.  Abdominal:     General: Abdomen is flat.     Palpations: There is no mass.     Tenderness: There is no abdominal tenderness. There is no guarding.  Musculoskeletal:        General: Normal range of motion.     Cervical back: Neck supple.     Right lower leg: No edema.     Left lower leg: No edema.  Lymphadenopathy:     Cervical: No cervical adenopathy.  Skin:    General: Skin is warm and dry.     Coloration: Skin is not pale.  Neurological:     General: No focal deficit present.     Mental Status: He is alert and oriented to person, place, and time. Mental status is at baseline.  Psychiatric:        Mood and Affect: Mood normal.        Behavior: Behavior normal.        Thought Content: Thought content normal.        Judgment: Judgment normal.    Lab Results  Component Value Date   WBC 5.7 01/04/2021   HGB 16.2 01/04/2021   HCT 48.0 01/04/2021   PLT 264.0 01/04/2021   GLUCOSE 88 01/04/2021   CHOL 149 02/12/2019   TRIG 76.0 02/12/2019   HDL 73.20 02/12/2019   LDLCALC 61 02/12/2019   ALT 21 01/04/2021   AST 18 01/04/2021   NA  140 01/04/2021   K 3.9 01/04/2021   CL 104 01/04/2021   CREATININE 0.99 01/04/2021   BUN 15 01/04/2021   CO2 28 01/04/2021   TSH 0.42 01/04/2021     Assessment & Plan:   Gregory Jennings was seen today for hypothyroidism.  Diagnoses and all orders for this visit:  Essential hypertension- His blood pressure is well controlled with lifestyle modifications. -     CBC with Differential/Platelet; Future -     Basic metabolic panel; Future -     TSH; Future -     Hepatic function panel; Future -     Hepatic function panel -     TSH -     Basic metabolic panel -     CBC with Differential/Platelet  Acquired hypothyroidism- His TSH is in the normal range.  I recommended that he stay on the current dose of levothyroxine. -     CBC with Differential/Platelet; Future -     Basic metabolic panel; Future -     TSH; Future -     Hepatic function panel; Future -     Hepatic function panel -     TSH -     Basic metabolic panel -     CBC with Differential/Platelet -     levothyroxine (SYNTHROID) 200 MCG tablet; TAKE 1 TABLET(200 MCG) BY MOUTH DAILY BEFORE AND BREAKFAST  Dysthymia- He is not interested in psychotherapy or medications.  He will let me know if he feels like this is turning into a depression.  I am having Gregory Jennings maintain his levothyroxine.  Meds ordered this encounter  Medications   levothyroxine (SYNTHROID) 200 MCG tablet    Sig: TAKE 1 TABLET(200 MCG) BY MOUTH DAILY BEFORE AND BREAKFAST    Dispense:  90 tablet    Refill:  1      Follow-up: Return in about 6 months (around 07/07/2021).  Sanda Linger, MD

## 2021-01-04 NOTE — Patient Instructions (Signed)

## 2021-01-05 DIAGNOSIS — F341 Dysthymic disorder: Secondary | ICD-10-CM | POA: Insufficient documentation

## 2021-01-05 MED ORDER — LEVOTHYROXINE SODIUM 200 MCG PO TABS: ORAL_TABLET | ORAL | 1 refills | Status: DC

## 2021-01-18 ENCOUNTER — Other Ambulatory Visit: Payer: Self-pay | Admitting: Internal Medicine

## 2021-01-18 DIAGNOSIS — E039 Hypothyroidism, unspecified: Secondary | ICD-10-CM

## 2021-01-24 ENCOUNTER — Ambulatory Visit: Payer: BC Managed Care – PPO | Admitting: Internal Medicine

## 2021-07-16 ENCOUNTER — Other Ambulatory Visit: Payer: Self-pay | Admitting: Internal Medicine

## 2021-07-16 DIAGNOSIS — E039 Hypothyroidism, unspecified: Secondary | ICD-10-CM

## 2021-09-08 ENCOUNTER — Ambulatory Visit: Payer: BC Managed Care – PPO | Admitting: Family Medicine

## 2021-09-08 ENCOUNTER — Encounter: Payer: Self-pay | Admitting: Family Medicine

## 2021-09-08 VITALS — BP 136/84 | HR 69 | Temp 98.0°F | Ht 68.0 in | Wt 145.0 lb

## 2021-09-08 DIAGNOSIS — E039 Hypothyroidism, unspecified: Secondary | ICD-10-CM | POA: Diagnosis not present

## 2021-09-08 DIAGNOSIS — I10 Essential (primary) hypertension: Secondary | ICD-10-CM

## 2021-09-08 DIAGNOSIS — F32A Depression, unspecified: Secondary | ICD-10-CM | POA: Diagnosis not present

## 2021-09-08 LAB — CBC WITH DIFFERENTIAL/PLATELET
Basophils Absolute: 0 10*3/uL (ref 0.0–0.1)
Basophils Relative: 0.9 % (ref 0.0–3.0)
Eosinophils Absolute: 0 10*3/uL (ref 0.0–0.7)
Eosinophils Relative: 0.4 % (ref 0.0–5.0)
HCT: 46.8 % (ref 39.0–52.0)
Hemoglobin: 15.9 g/dL (ref 13.0–17.0)
Lymphocytes Relative: 32.9 % (ref 12.0–46.0)
Lymphs Abs: 1.7 10*3/uL (ref 0.7–4.0)
MCHC: 34 g/dL (ref 30.0–36.0)
MCV: 89.3 fl (ref 78.0–100.0)
Monocytes Absolute: 0.5 10*3/uL (ref 0.1–1.0)
Monocytes Relative: 9.8 % (ref 3.0–12.0)
Neutro Abs: 2.9 10*3/uL (ref 1.4–7.7)
Neutrophils Relative %: 56 % (ref 43.0–77.0)
Platelets: 263 10*3/uL (ref 150.0–400.0)
RBC: 5.24 Mil/uL (ref 4.22–5.81)
RDW: 13.1 % (ref 11.5–15.5)
WBC: 5.1 10*3/uL (ref 4.0–10.5)

## 2021-09-08 LAB — COMPREHENSIVE METABOLIC PANEL
ALT: 21 U/L (ref 0–53)
AST: 22 U/L (ref 0–37)
Albumin: 4.7 g/dL (ref 3.5–5.2)
Alkaline Phosphatase: 38 U/L — ABNORMAL LOW (ref 39–117)
BUN: 11 mg/dL (ref 6–23)
CO2: 26 mEq/L (ref 19–32)
Calcium: 9.4 mg/dL (ref 8.4–10.5)
Chloride: 103 mEq/L (ref 96–112)
Creatinine, Ser: 0.75 mg/dL (ref 0.40–1.50)
GFR: 112.83 mL/min (ref >60.00)
Glucose, Bld: 75 mg/dL (ref 70–99)
Potassium: 3.8 mEq/L (ref 3.5–5.1)
Sodium: 138 mEq/L (ref 135–145)
Total Bilirubin: 1 mg/dL (ref 0.2–1.2)
Total Protein: 6.9 g/dL (ref 6.0–8.3)

## 2021-09-08 LAB — TSH: TSH: 0.38 u[IU]/mL (ref 0.35–5.50)

## 2021-09-08 NOTE — Progress Notes (Signed)
? ?  Subjective:  ? ? Patient ID: Gregory Jennings, male    DOB: September 14, 1980, 41 y.o.   MRN: [id number] ? ?HPI ?Chief Complaint  ?Patient presents with  ? Hypothyroidism  ?  Would like thyroid checked to rule out if it has anything to do with his recent depression. Between his last visit and now his depression has been up and down but last 2 weeks have been harder. ? ?Does not need recommendation for counselor.   ? ?States he has been on thyroid medication for the past 5 years and would like to have his TSH checked.  ?Reports worsening depression over the past 2 weeks. States he feels "sad".  ?States he has a history of the same. Cannot pinpoint cause.  ?He is working on scheduling with a therapist. States his wife is supportive.  ? ?Reports making changes recently in regards to diet and has been eating more of a vegetarian diet over the past 4 weeks.  ? ?Decreased appetite  ? ?He has increased his exercise.  ? ?HTN- controlled with diet and exercise.  ? ?Reports "heavy" marijuana use.  ? ? ? ? ? ?  09/08/2021  ?  1:45 PM 01/04/2021  ?  3:28 PM 08/26/2019  ?  2:47 PM 02/12/2019  ?  9:41 AM 03/29/2017  ? 10:06 AM  ?Depression screen PHQ 2/9  ?Decreased Interest 2 1 0 0 0  ?Down, Depressed, Hopeless 2 2 0 0 0  ?PHQ - 2 Score 4 3 0 0 0  ?Altered sleeping 1 0 0 0   ?Tired, decreased energy 0 1 0 0   ?Change in appetite 2 1 0 0   ?Feeling bad or failure about yourself  2 2 0 0   ?Trouble concentrating 1 0 0 0   ?Moving slowly or fidgety/restless 0 0 0 0   ?Suicidal thoughts 0 1 0 0   ?PHQ-9 Score 10 8 0 0   ?Difficult doing work/chores   Not difficult at all Not difficult at all   ? ? ? ?Review of Systems ?Pertinent positives and negatives in the history of present illness. ? ?   ?Objective:  ? Physical Exam ?BP 136/84 (BP Location: Left Arm, Patient Position: Sitting, Cuff Size: Large)   Pulse 69   Temp 98 ?F (36.7 ?C) (Temporal)   Ht 5\' 8"  (1.727 m)   Wt 145 lb (65.8 kg)   SpO2 99%   BMI 22.05 kg/m?  ? ?Alert and in no  distress. Tympanic membranes and canals are normal. Pharyngeal area is normal. Neck is supple without adenopathy or thyromegaly. Cardiac exam shows a regular rate and rhythm. Lungs are clear to auscultation. Skin is warm and dry. Mood is normal.  ? ? ? ?   ?Assessment & Plan:  ?Acquired hypothyroidism - Plan: TSH, TSH ? ?Depression, unspecified depression type ? ?Essential hypertension - Plan: Comprehensive metabolic panel, CBC with Differential/Platelet, CBC with Differential/Platelet, Comprehensive metabolic panel ? ?Continue current levothyroxine dose. He is taking it appropriately.  ?Follow up pending labs.  ?Unclear as to what has triggered worsening depression recently. He is scheduling with a therapist. Look for underlying physiological etiology.  ?HTN- BP controlled with diet and exercise. Continue to monitor.  ?Follow up with Dr. Ronnald Ramp for CPE and chronic conditions.  ? ?

## 2021-09-08 NOTE — Patient Instructions (Signed)
It was a pleasure meeting you today.  ? ?Please go to the lab downstairs before you leave.  ? ?Continue your current medication.  ? ?I recommend taking a multi-vitamin which contains at least 1,000 IUs of vitamin D3.  ? ?We will be in touch with your lab results.  ?

## 2021-09-29 ENCOUNTER — Encounter: Payer: Self-pay | Admitting: Family Medicine

## 2021-09-29 NOTE — Telephone Encounter (Signed)
Pt would prefer to transfer from Dr. Yetta Barre to [facility] for his care and would like to schedule his annual exam with Vickie as well. Please advise.  ?

## 2022-01-24 ENCOUNTER — Other Ambulatory Visit: Payer: Self-pay | Admitting: Internal Medicine

## 2022-01-24 DIAGNOSIS — E039 Hypothyroidism, unspecified: Secondary | ICD-10-CM

## 2023-03-15 ENCOUNTER — Ambulatory Visit: Payer: BC Managed Care – PPO | Admitting: Podiatry

## 2023-03-15 DIAGNOSIS — L600 Ingrowing nail: Secondary | ICD-10-CM | POA: Diagnosis not present

## 2023-03-15 DIAGNOSIS — M205X2 Other deformities of toe(s) (acquired), left foot: Secondary | ICD-10-CM

## 2023-03-15 NOTE — Progress Notes (Signed)
  Subjective:  Patient ID: Gregory Jennings, male    DOB: 14-Jan-1981,  MRN: [id number]  Chief Complaint  Patient presents with   Ingrown Toenail    left pinky toenail ingrown causing pain    42 y.o. male presents with concern for pain on the lateral aspect of the fifth toe.  Has a callus there as well as nail that is pressing to the side of the toe.  Denies any drainage from the area.  Past Medical History:  Diagnosis Date   Acquired hypothyroidism 03/29/2017   Atopic dermatitis 03/29/2017   Essential hypertension 03/29/2017   Hypertension    Nicotine dependence 12/27/2012   Pure hypercholesterolemia 03/29/2017   Rosacea 03/29/2017   Seasonal allergic rhinitis due to pollen 03/29/2017    No Known Allergies  ROS: Negative except as per HPI above  Objective:  General: AAO x3, NAD  Dermatological: Ingrown lateral border of the fifth toenail with significant callus formation on the lateral aspect of the toe.  Very painful to palpation.  Vascular:  Dorsalis Pedis artery and Posterior Tibial artery pedal pulses are 2/4 bilateral.  Capillary fill time < 3 sec to all digits.   Neruologic: Grossly intact via light touch bilateral. Protective threshold intact to all sites bilateral.   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   No images are attached to the encounter.  Radiographs:  Deferred Assessment:   1. Ingrown nail of great toe of left foot   2. 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    Ingrown Nail, left fifth toenail lateral border with callus summation -Patient elects to proceed with minor surgery to remove ingrown toenail today. Consent reviewed and signed by patient. -Ingrown nail excised. See procedure note. -Educated on post-procedure care including soaking. Written instructions provided and reviewed. -Patient  to follow up in 2 weeks for nail check.  Procedure: Excision of Ingrown Toenail Location: Left 5th toe  total  nail  Anesthesia: Lidocaine 1% plain; 1.5 mL and Marcaine 0.5% plain; 1.5 mL, digital block. Skin Prep: Betadine. Dressing: Silvadene; telfa; dry, sterile, compression dressing. Technique: Following skin prep, the toe was exsanguinated and a tourniquet was secured at the base of the toe. The affected nail border was freed, split with a nail splitter, and excised. Chemical matrixectomy was then performed with phenol and irrigated out with alcohol. The tourniquet was then removed and sterile dressing applied. Disposition: Patient tolerated procedure well. Patient to return in 2 weeks for follow-up.   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          Corinna Gab, DPM [facility]

## 2023-03-15 NOTE — Patient Instructions (Signed)

## 2023-03-16 ENCOUNTER — Ambulatory Visit: Payer: BC Managed Care – PPO | Admitting: Podiatry
